# Patient Record
Sex: Male | Born: 1985
Health system: Southern US, Community
[De-identification: ages and names within clinical notes are randomized; demographics above are authoritative.]

## PROBLEM LIST (undated history)

## (undated) DIAGNOSIS — E119 Type 2 diabetes mellitus without complications: Secondary | ICD-10-CM

---
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
## 2013-11-22 ENCOUNTER — Encounter (HOSPITAL_COMMUNITY): Payer: Self-pay | Admitting: Emergency Medicine

## 2013-11-22 ENCOUNTER — Emergency Department (INDEPENDENT_AMBULATORY_CARE_PROVIDER_SITE_OTHER)
Admission: EM | Admit: 2013-11-22 | Discharge: 2013-11-22 | Disposition: A | Discharge status: Home / Self Care | Payer: Medicaid Other | Source: Home / Self Care | Attending: Family Medicine | Admitting: Family Medicine

## 2013-11-22 DIAGNOSIS — E119 Type 2 diabetes mellitus without complications: Secondary | ICD-10-CM

## 2013-11-22 DIAGNOSIS — Z794 Long term (current) use of insulin: Secondary | ICD-10-CM

## 2013-11-22 HISTORY — DX: Type 2 diabetes mellitus without complications: E11.9

## 2013-11-22 LAB — POCT I-STAT, CHEM 8
BUN: 11 mg/dL (ref 6–23)
Calcium, Ion: 1.21 mmol/L (ref 1.12–1.23)
Chloride: 100 mEq/L (ref 96–112)
Creatinine, Ser: 0.9 mg/dL (ref 0.50–1.35)
GLUCOSE: 246 mg/dL — AB (ref 70–99)
HEMATOCRIT: 46 % (ref 39.0–52.0)
HEMOGLOBIN: 15.6 g/dL (ref 13.0–17.0)
Potassium: 4.3 mEq/L (ref 3.7–5.3)
Sodium: 139 mEq/L (ref 137–147)
TCO2: 29 mmol/L (ref 0–100)

## 2013-11-22 MED ORDER — INSULIN NPH (HUMAN) (ISOPHANE) 100 UNIT/ML ~~LOC~~ SUSP: 30.0000 [IU] | Freq: Two times a day (BID) | SUBCUTANEOUS | Status: DC

## 2013-11-22 NOTE — ED Notes (Signed)
Here w friend who is acting as Nurse, learning disabilitytranslator. Needs medication for his DM; has an appointment later this month

## 2013-11-22 NOTE — Discharge Instructions (Signed)
See your doctor as planned for further diabetes care.

## 2013-11-22 NOTE — ED Provider Notes (Signed)
CSN: [id number]     Arrival date & time 11/22/13  1718 History   First MD Initiated Contact with Patient 11/22/13 1758     Chief Complaint  Patient presents with  . Diabetes   (Consider location/radiation/quality/duration/timing/severity/associated sxs/prior Treatment) Patient is a 28 y.o. male presenting with diabetes problem. The history is provided by the patient and a relative. The history is limited by a language barrier. Language interpreter used: friend transl.  Diabetes This is a chronic problem. Episode onset: here from [place], takes insulin for dm, out of med, took dose this am, sugar was 75. The problem has not changed since onset.Pertinent negatives include no chest pain and no abdominal pain.    Past Medical History  Diagnosis Date  . Diabetes mellitus without complication    History reviewed. No pertinent past surgical history. History reviewed. No pertinent family history. History  Substance Use Topics  . Smoking status: Never Smoker   . Smokeless tobacco: Not on file  . Alcohol Use: No    Review of Systems  Constitutional: Negative.   Respiratory: Negative.   Cardiovascular: Negative.  Negative for chest pain.  Gastrointestinal: Negative.  Negative for abdominal pain.  Neurological: Negative.     Allergies  Review of patient's allergies indicates no known allergies.  Home Medications   Prior to Admission medications   Not on File   BP 137/81  Pulse 75  Temp(Src) 98.5 F (36.9 C) (Oral)  Resp 12  SpO2 99% Physical Exam  Nursing note and vitals reviewed. Constitutional: He is oriented to person, place, and time. He appears well-developed and well-nourished.  Neck: Normal range of motion. Neck supple.  Cardiovascular: Normal heart sounds.   Pulmonary/Chest: Effort normal and breath sounds normal.  Abdominal: Soft. Bowel sounds are normal.  Lymphadenopathy:    He has no cervical adenopathy.  Neurological: He is alert and oriented to person, place, and  time.    ED Course  Procedures (including critical care time) Labs Review Labs Reviewed  POCT I-STAT, CHEM 8 - Abnormal; Notable for the following:    Glucose, Bld 246 (*)    All other components within normal limits    Imaging Review No results found.   MDM   1. Diabetes mellitus type 2, insulin dependent        Linna HoffJames D Cuahutemoc Attar, MD 11/22/13 [phone]

## 2013-12-04 ENCOUNTER — Other Ambulatory Visit (HOSPITAL_COMMUNITY)
Admission: RE | Admit: 2013-12-04 | Discharge: 2013-12-04 | Disposition: A | Discharge status: Home / Self Care | Payer: Medicaid Other | Source: Ambulatory Visit | Attending: Family Medicine | Admitting: Family Medicine

## 2013-12-04 ENCOUNTER — Ambulatory Visit (INDEPENDENT_AMBULATORY_CARE_PROVIDER_SITE_OTHER): Payer: Medicaid Other | Admitting: Family Medicine

## 2013-12-04 VITALS — BP 115/74 | HR 60 | Temp 98.2°F | Ht 64.5 in | Wt 127.0 lb

## 2013-12-04 DIAGNOSIS — Z23 Encounter for immunization: Secondary | ICD-10-CM

## 2013-12-04 DIAGNOSIS — Z0289 Encounter for other administrative examinations: Secondary | ICD-10-CM | POA: Insufficient documentation

## 2013-12-04 DIAGNOSIS — Z113 Encounter for screening for infections with a predominantly sexual mode of transmission: Secondary | ICD-10-CM | POA: Insufficient documentation

## 2013-12-04 DIAGNOSIS — E1139 Type 2 diabetes mellitus with other diabetic ophthalmic complication: Secondary | ICD-10-CM

## 2013-12-04 DIAGNOSIS — E11319 Type 2 diabetes mellitus with unspecified diabetic retinopathy without macular edema: Secondary | ICD-10-CM

## 2013-12-04 DIAGNOSIS — IMO0002 Reserved for concepts with insufficient information to code with codable children: Secondary | ICD-10-CM

## 2013-12-04 DIAGNOSIS — E1065 Type 1 diabetes mellitus with hyperglycemia: Secondary | ICD-10-CM

## 2013-12-04 LAB — CBC WITH DIFFERENTIAL/PLATELET
BASOS ABS: 0.1 10*3/uL (ref 0.0–0.1)
Basophils Relative: 1 % (ref 0–1)
EOS PCT: 5 % (ref 0–5)
Eosinophils Absolute: 0.3 10*3/uL (ref 0.0–0.7)
HCT: 39.1 % (ref 39.0–52.0)
Hemoglobin: 13.5 g/dL (ref 13.0–17.0)
LYMPHS PCT: 40 % (ref 12–46)
Lymphs Abs: 2.2 10*3/uL (ref 0.7–4.0)
MCH: 29.2 pg (ref 26.0–34.0)
MCHC: 34.5 g/dL (ref 30.0–36.0)
MCV: 84.4 fL (ref 78.0–100.0)
MONO ABS: 0.2 10*3/uL (ref 0.1–1.0)
MONOS PCT: 4 % (ref 3–12)
Neutro Abs: 2.8 10*3/uL (ref 1.7–7.7)
Neutrophils Relative %: 50 % (ref 43–77)
Platelets: 251 10*3/uL (ref 150–400)
RBC: 4.63 MIL/uL (ref 4.22–5.81)
RDW: 13.6 % (ref 11.5–15.5)
WBC: 5.5 10*3/uL (ref 4.0–10.5)

## 2013-12-04 LAB — POCT URINALYSIS DIPSTICK
Bilirubin, UA: NEGATIVE
GLUCOSE UA: 500
KETONES UA: NEGATIVE
Leukocytes, UA: NEGATIVE
Nitrite, UA: NEGATIVE
Protein, UA: NEGATIVE
RBC UA: NEGATIVE
SPEC GRAV UA: 1.025
UROBILINOGEN UA: 0.2
pH, UA: 6

## 2013-12-04 LAB — POCT GLYCOSYLATED HEMOGLOBIN (HGB A1C): Hemoglobin A1C: 7

## 2013-12-04 MED ORDER — INSULIN NPH (HUMAN) (ISOPHANE) 100 UNIT/ML ~~LOC~~ SUSP: 30.0000 [IU] | Freq: Two times a day (BID) | SUBCUTANEOUS | Status: DC

## 2013-12-04 NOTE — Assessment & Plan Note (Signed)
Uncontrolled based on home numbers. Will check A1c today. Continue NPH BID for now. Referral to endocrine placed. Follow up in 6 weeks.

## 2013-12-04 NOTE — Patient Instructions (Addendum)
It was nice to meet you!  We have sent a prescription for your insulin to [facility] on [address]. We also put in a referral for you to see a diabetes specialist.    Follow up in the next 6 weeks.

## 2013-12-04 NOTE — Assessment & Plan Note (Signed)
Screening labs obtained today. Tetanus given today.

## 2013-12-04 NOTE — Assessment & Plan Note (Signed)
Has appt with eye specialist tomorrow.

## 2013-12-04 NOTE — Progress Notes (Signed)
   Subjective:    Patient ID: Barry Balsamli Sick, male    DOB: Mar 23, 1986, 28 y.o.   MRN: [id number]  HPI Barry Potter is here for a new patient appointment.  Interpreter present for entire visit.  He is here today to establish care.  States he has diabetes.  Diagnosed about 10 years ago.  He has always been on insulin.  In [place] he took insulin before meals.  Since being here, he has been using NPH BID.  He does not like giving himself the insulin shots.  CBGs at home are around 250.    He was seen by an eye doctor yesterday and referred to an eye specialist for problems in the left eye.  No other acute concerns.  Current Outpatient Prescriptions on File Prior to Visit  Medication Sig Dispense Refill  . insulin NPH Human (HUMULIN N,NOVOLIN N) 100 UNIT/ML injection Inject 0.3 mLs (30 Units total) into the skin 2 (two) times daily before a meal.  10 mL  11   No current facility-administered medications on file prior to visit.    I have reviewed and updated the following as appropriate: allergies, current medications, past family history, past medical history, past social history, past surgical history and problem list SHx: never smoker PHMx: diabetes  Arrived in [place]:  Date: early April 2015 Home country: [place] Reason for leaving: fled [place] as family worked for [place] company  Language:  Arabic Does Futures traderequire intepreter   Education:  8th grade; worked in Firefighterreception  Preventative Care History:  Vaccinations: unknown   Other:  Tobacco: none  EtOH: none   [facility]   Review of Systems See HPI    Objective:   Physical Exam BP 115/74  Pulse 60  Temp(Src) 98.2 F (36.8 C) (Oral)  Ht 5' 4.5" (1.638 m)  Wt 127 lb (57.607 kg)  BMI 21.47 kg/m2 Gen: alert, cooperative, NAD HEENT: AT/[place], sclera white, MMM, PERRL Neck: supple, no LAD, normal thyroid CV: RRR, no murmurs Pulm: CTAB, no wheezes or rales Abd: +BS, soft, NTND, no hepatosplenomegaly Ext: no edema, 2+ DP pulses  bilaterally      Assessment & Plan:

## 2013-12-05 LAB — SICKLE CELL SCREEN: SICKLE CELL SCREEN: NEGATIVE

## 2013-12-05 LAB — COMPREHENSIVE METABOLIC PANEL
ALT: 15 U/L (ref 0–53)
AST: 16 U/L (ref 0–37)
Albumin: 4.1 g/dL (ref 3.5–5.2)
Alkaline Phosphatase: 39 U/L (ref 39–117)
BUN: 14 mg/dL (ref 6–23)
CO2: 29 mEq/L (ref 19–32)
Calcium: 9.5 mg/dL (ref 8.4–10.5)
Chloride: 100 mEq/L (ref 96–112)
Creat: 0.84 mg/dL (ref 0.50–1.35)
Glucose, Bld: 343 mg/dL — ABNORMAL HIGH (ref 70–99)
Potassium: 4.5 mEq/L (ref 3.5–5.3)
Sodium: 135 mEq/L (ref 135–145)
Total Bilirubin: 0.6 mg/dL (ref 0.2–1.2)
Total Protein: 6.6 g/dL (ref 6.0–8.3)

## 2013-12-05 LAB — VARICELLA ZOSTER ANTIBODY, IGG: Varicella IgG: 1992 Index — ABNORMAL HIGH (ref <135.00)

## 2013-12-05 LAB — HEPATITIS B SURFACE ANTIGEN: HEP B S AG: NEGATIVE

## 2013-12-06 LAB — QUANTIFERON TB GOLD ASSAY (BLOOD)
INTERFERON GAMMA RELEASE ASSAY: NEGATIVE
Mitogen value: >10 IU/mL
Quantiferon Nil Value: 0.03 IU/mL
Quantiferon Tb Ag Minus Nil Value: 0.01 IU/mL
TB AG VALUE: 0.04 [IU]/mL

## 2013-12-25 ENCOUNTER — Ambulatory Visit: Payer: Self-pay | Admitting: Internal Medicine

## 2014-01-22 ENCOUNTER — Encounter: Payer: Self-pay | Admitting: Internal Medicine

## 2014-01-22 ENCOUNTER — Ambulatory Visit (INDEPENDENT_AMBULATORY_CARE_PROVIDER_SITE_OTHER): Payer: Medicaid Other | Admitting: Internal Medicine

## 2014-01-22 VITALS — BP 122/80 | HR 70 | Temp 98.2°F | Resp 12 | Ht 66.0 in | Wt 130.0 lb

## 2014-01-22 DIAGNOSIS — IMO0002 Reserved for concepts with insufficient information to code with codable children: Secondary | ICD-10-CM

## 2014-01-22 DIAGNOSIS — E1065 Type 1 diabetes mellitus with hyperglycemia: Secondary | ICD-10-CM

## 2014-01-22 MED ORDER — INSULIN PEN NEEDLE 32G X 4 MM MISC: Status: DC

## 2014-01-22 MED ORDER — INSULIN ASPART 100 UNIT/ML FLEXPEN: 5.0000 [IU] | PEN_INJECTOR | Freq: Three times a day (TID) | SUBCUTANEOUS | Status: DC

## 2014-01-22 MED ORDER — INSULIN GLARGINE 100 UNIT/ML SOLOSTAR PEN: 25.0000 [IU] | PEN_INJECTOR | Freq: Every day | SUBCUTANEOUS | Status: DC

## 2014-01-22 MED ORDER — GLUCAGON (RDNA) 1 MG IJ KIT: 1.0000 mg | PACK | Freq: Once | INTRAMUSCULAR | Status: DC | PRN

## 2014-01-22 NOTE — Progress Notes (Signed)
Patient ID: Barry Potter, male   DOB: Nov 26, 1985, 28 y.o.   MRN: [id number]  HPI: Barry Potter is a 28 y.o.-year-old male, referred by his PCP, Dr. Bridgett Larsson, for management of DM1, uncontrolled, without complications.  Patient has been diagnosed with diabetes in 2002; he started on insulin at dx.   Last hemoglobin A1c was: Lab Results  Component Value Date   HGBA1C 7.0 12/04/2013   Pt is on: - Humulin NPH 30 units in am and 20 units before dinner This was changed at he time he came to [place] from [place] (4 mo ago)  Pt checks his sugars 2x a day and they are: - am: 60-350 - 2h after b'fast: n/c - before lunch: n/c - 2h after lunch: n/c - before dinner: n/c - 2h after dinner: [name] - bedtime: 130-150 - nighttime: n/c No lows. Lowest sugar was 40; he has hypoglycemia awareness at 60. No previous hypoglycemia admission.  Does not have a glucagon kit at home. Highest sugar was 300s . No previous DKA admissions. He was admitted in 2005 for high sugars ? DKA.  Pt's meals are: - 4 pm: egg, tea, bread - largest meal of the day - 10-11 pm: rice, meat, salad - 9 am: yoghurt, salad, rice - smallest meal of the day - Snacks: 3x or more  He works 8 pm to 8 am.   - no CKD, last BUN/creatinine:  Lab Results  Component Value Date   BUN 14 12/04/2013   CREATININE 0.84 12/04/2013  - last eye exam was in 2 weeks. + R DR - mild (reportedly) - no numbness and tingling in his feet.  Pt has FH of DM in sister.  ROS: Constitutional: no weight gain/loss, no fatigue, no subjective hyperthermia/hypothermia Eyes: + blurry vision, no xerophthalmia ENT: no sore throat, no nodules palpated in throat, no dysphagia/odynophagia, no hoarseness Cardiovascular: no CP/SOB/palpitations/leg swelling Respiratory: no cough/SOB Gastrointestinal: no N/V/D/C Musculoskeletal: no muscle/joint aches Skin: no rashes Neurological: no tremors/numbness/tingling/dizziness Psychiatric: no depression/anxiety  Past Medical History   Diagnosis Date  . Diabetes mellitus without complication    Past Surgical History  Procedure Laterality Date  . Tonsillectomy  2001   History   Social History  . Marital Status: Single    Spouse Name: N/A    Number of Children: 0   Occupational History  . 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   Social History Main Topics  . Smoking status: Never Smoker   . Smokeless tobacco: Never Used  . Alcohol Use: No  . Drug Use: No   Current Outpatient Rx  Name  Route  Sig  Dispense  Refill  . insulin NPH Human (HUMULIN N,NOVOLIN N) 100 UNIT/ML injection   Subcutaneous   Inject 30 Units into the skin every morning. 18 units at night.         Marland Kitchen VITAMIN D, ERGOCALCIFEROL, PO   Oral   Take 2 tablets by mouth daily.          No Known Allergies Family History  Problem Relation Age of Onset  . Diabetes Sister     PE: BP 122/80  Pulse 70  Temp(Src) 98.2 F (36.8 C) (Oral)  Resp 12  Ht '5\' 6"'  (1.676 m)  Wt 130 lb (58.968 kg)  BMI 20.99 kg/m2  SpO2 98% Wt Readings from Last 3 Encounters:  01/22/14 130 lb (58.968 kg)  12/04/13 127 lb (57.607 kg)   Constitutional: thin, in NAD Eyes: PERRLA,  EOMI, no exophthalmos ENT: moist mucous membranes, no thyromegaly, no cervical lymphadenopathy Cardiovascular: RRR, No MRG Respiratory: CTA B Gastrointestinal: abdomen soft, NT, ND, BS+ Musculoskeletal: no deformities, strength intact in all 4 Skin: moist, warm, no rashes Neurological: no tremor with outstretched hands, DTR normal in all 4  ASSESSMENT: 1. DM1, uncontrolled, without complications  PLAN:  1. Patient with long standing DM1, on insulin therapy. He is interested in a pump - discussed how the pump works - he would like to get this before his M'aid runs out in ~ 3 mo. For now, since a NPH-only regimen is not an optimal regimen for a DM1 pt >> we decided to change to: - We discussed about changes to his insulin regimen, as follows:  Patient  Instructions  Please stop Humulin N. Start Lantus 25 units at bedtime. Start NovoLog  - 8 units with the 4 pm meal - 6 units with the 10 pm meal - 5 units with the 9 am meal Please come to the lab fasting at any time in the next month - ideally at 4 pm before your meal. Make sure your sugar is <180 before you come as the labs that we are going to check require a sugar of <220. Please come back in 1 month with your sugar log. Please call with any question or concern. - will switch to pens - sent needles and pens to his pharmacy - given brochure and also demonstrated pen use - Strongly advised him to start checking sugars at different times of the day - check at least 3 times a day, rotating checks - given sugar log and advised how to fill it and to bring it at next appt  - given foot care handout and explained the principles  - given instructions for hypoglycemia management "15-15 rule"  - advised for yearly eye exams - sent glucagon kit Rx to pharmacy - advised to get ketone strips - advised to always have Glu tablets with him - advised for a Med-alert bracelet mentioning "type 1 diabetes mellitus". - will refer to DM education at next visit - given instruction Re: exercising and driving in DM1 (pt instructions) - no signs of other autoimmune disorders - but will check a TSH - will check a fasting C-peptide and Glu - he is to come back fasting in the next few days - Return to clinic in 1 mo with sugar log   - time spent with the patient: 1 hour, of which >50% was spent in obtaining information about his disease, reviewing previous labs, office visit notes, and DM treatments, and the majority of time counseling pt about his condition (please see the discussed topics above), and developing a plan to prevent further hypoglycemia and hyperglycemia.

## 2014-01-22 NOTE — Patient Instructions (Addendum)
Please stop Humulin N. Start Lantus 25 units at bedtime. Start NovoLog  - 8 units with the 4 pm meal - 6 units with the 10 pm meal - 5 units with the 9 am meal  Please come to the lab fasting at any time in the next month - ideally at 4 pm before your meal. Make sure your sugar is <180 before you come as the labs that we are going to check require a sugar of <220.  Please come back in 1 month with your sugar log.  Please call with any question or concern.  Basic Rules for Patients with Type I Diabetes Mellitus  1. The American Diabetes Association (ADA) recommended targets: - fasting sugar <130 - after meal sugar <180 - HbA1C <7%  2. Engage in ?150 min moderate exercise per week  3. Make sure you have ?8h of sleep every night as this helps both blood sugars and your weight.  4. Always keep a sugar log (not only record in your meter) and bring it to all appointments with us.  5. If you are on a pump, know how to access the settings and to modify the parameters.  6.  Remember, you can always call the number on the back of the pump for emergencies related to the pump.  7. "15-15 rule" for hypoglycemia: if sugars are low, take 15 g of carbs** ("fast sugar" - e.g. 4 glucose tablets, 4 oz orange juice), wait 15 min, then check sugars again. If still <80, repeat. Continue  until your sugars >80, then eat a normal meal.   8. Teach family members and coworkers to inject glucagon. Have a glucagon set at home and one at work. They should call 911 after using the set.  9. If you are on a pump, set "insulin on board" time for 5 hours (if your sugars tend to be higher, can use 4 hours).   10. If you are on a pump, use the "dual wave bolus" setting for high fat foods (e.g. pizza). Start with a setting of 50%-50% (50% instant bolus and 50% prolonged bolus over 3h, for e.g.).    11. If you are on a pump, make sure the basal daily insulin dose is approximately equal (not larger) to the daily  insulin you get from boluses, otherwise you are at risk for hypoglycemia.  12. Check sugar before driving. If <100, correct, and only start driving if sugars rise ?161100. Check sugar every hour when on a long drive.  13. Check sugar before exercising. If <100, correct, and only start exercising if sugars rise ?100. Check sugar every hour when on a long exercise routine and 1h after you finished exercising.   If >250, check urine for ketones. If you have moderate-large ketones in urine, do not start exercise. Hydrate yourself with clear liquids and correct the high sugar. Recheck sugars and ketones before attempting to exercise.  Be aware that you might need less insulin when exercising.  *intense, short, exercise bursts can increase your sugars, but  *less intense, longer (>1h), exercise routines can decrease your sugars.  If you are on a pump, you might need to decrease your basal rate by 10% or more (or even disconnect your pump) while you exercise to prevent low sugars. Do not disconnect your pump by more than 3 hours at a time! You also might need to decrease your insulin bolus for the meal prior to your exercise time by 20% or more.  14. Make  sure you have a MedAlert bracelet or pendant mentioning "Type I Diabetes Mellitus". If you have a prior episode of severe hypoglycemia or hypoglycemia unawareness, it should also mention this.  15. Please do not walk barefoot. Inspect your feet for sores/cuts and let us know if you have them.  16. Please call [facility] with any questions and concerns [phone]).   **E.g. of "fast carbs":   first choice (15 g):  1 tube glucose gel, GlucoPouch 15, 2 oz glucose liquid   second choice (15-16 g):  3 or 4 glucose tablets (best taken  with water), 15 Dextrose Bits chewable   third choice (15-20 g):   cup fruit juice,  cup regular soda, 1 cup skim milk,  1 cup sports drink   fourth choice (15-20 g):  1 small tube [place] gel (not  frosting), 2 tbsp raisins, 1 tbsp table sugar,  candy, jelly beans, gum drops - check package for carb amount   (adapted from: Juluis RainierMcCall A.L. "Insulin therapy and hypoglycemia" Endocrinol Metab Clin N Am 2012, 41: 57-87)

## 2014-02-22 ENCOUNTER — Other Ambulatory Visit: Payer: Self-pay | Admitting: *Deleted

## 2014-02-22 ENCOUNTER — Encounter: Payer: Self-pay | Admitting: Internal Medicine

## 2014-02-22 ENCOUNTER — Ambulatory Visit (INDEPENDENT_AMBULATORY_CARE_PROVIDER_SITE_OTHER): Payer: Medicaid Other | Admitting: Internal Medicine

## 2014-02-22 VITALS — BP 118/68 | HR 75 | Temp 98.6°F | Resp 12 | Wt 129.0 lb

## 2014-02-22 DIAGNOSIS — E10319 Type 1 diabetes mellitus with unspecified diabetic retinopathy without macular edema: Secondary | ICD-10-CM

## 2014-02-22 DIAGNOSIS — E11319 Type 2 diabetes mellitus with unspecified diabetic retinopathy without macular edema: Secondary | ICD-10-CM

## 2014-02-22 DIAGNOSIS — IMO0002 Reserved for concepts with insufficient information to code with codable children: Secondary | ICD-10-CM

## 2014-02-22 DIAGNOSIS — E1065 Type 1 diabetes mellitus with hyperglycemia: Secondary | ICD-10-CM

## 2014-02-22 DIAGNOSIS — E1039 Type 1 diabetes mellitus with other diabetic ophthalmic complication: Secondary | ICD-10-CM

## 2014-02-22 LAB — GLUCOSE, POCT (MANUAL RESULT ENTRY): POC Glucose: 249 mg/dl — AB (ref 70–99)

## 2014-02-22 MED ORDER — BLOOD GLUCOSE TEST VI STRP: ORAL_STRIP | Status: DC

## 2014-02-22 NOTE — Patient Instructions (Addendum)
Decrease Lantus to 25 units in am  Decrease NovoLog  - 6 units with breakfast - 5 units with lunch - 3 units with dinner   Inject the NovoLog 10-15 min before you eat. Do not skip doses.  Please come back in 2 weeks with your sugar log.

## 2014-02-22 NOTE — Progress Notes (Signed)
Patient ID: Barry Potter, male   DOB: April 10, 1986, 28 y.o.   MRN: [id number]  HPI: Barry Potter is a 28 y.o.-year-old male, initially referred by his PCP, Dr. Bridgett Larsson, for management of DM1, dx 2002, uncontrolled, with complications (mild DR). He returns for f/u. Last visit 1 mo ago.  Last hemoglobin A1c was: Lab Results  Component Value Date   HGBA1C 7.0 12/04/2013   Pt was on: - Humulin NPH 30 units in am and 20 units before dinner This was changed at he time he came to [place] from [place] 07/2013.  We changed to: Lantus 25 units in am (not at bedtime as advised). NovoLog  - 8 units with the 4 pm meal - 6 units with the 10 pm meal - 5 units with the 9 am meal  He is now using (!): Lantus 25 in am and 20 units in pm He mentions his sugars dropped when he was taking Lantus and NovoLog as prescribed. He cannot call me as he does not speak [place].   Pt checks his sugars 2x a day and they are: - am: 60-350 >> 48-135 - 2h after b'fast: n/c >> 60-200 - before lunch: n/c >> 65-185 - 2h after lunch: n/c >> 170-200 - before dinner: n/c >> 71-175 - 2h after dinner: [place] >> 110-200 - bedtime: 130-150 >> 120-190 - nighttime: n/c >> 60-120 No lows. Lowest sugar was 48; he has hypoglycemia awareness at 60. No previous hypoglycemia admission.  Does not have a glucagon kit at home. Highest sugar was 200. No previous DKA admissions. He was admitted in 2005 for high sugars ? DKA.  Pt's meals are: - am: egg, tea, bread - largest meal of the day - lunch: rice, meat, salad - dinner: yoghurt, salad, rice - smallest meal of the day - Snacks: 3x or more  He worked 8 pm to 8 am >> now works 6 am-6 pm.  - no CKD, last BUN/creatinine:  Lab Results  Component Value Date   BUN 14 12/04/2013   CREATININE 0.84 12/04/2013  - last eye exam was in 11/2013. + R DR - mild (reportedly) - no numbness and tingling in his feet.  I reviewed pt's medications, allergies, PMH, social hx, family hx and no changes required,  except as mentioned above.  ROS: Constitutional: + weight loss, + fatigue, no subjective hyperthermia/hypothermia, + poor sleep Eyes: + blurry vision, no xerophthalmia ENT: no sore throat, no nodules palpated in throat, + dysphagia/no odynophagia, no hoarseness, + tinnitus Cardiovascular: no CP/SOB/palpitations/leg swelling Respiratory: no cough/SOB Gastrointestinal: no N/V/D/C Musculoskeletal: no muscle/joint aches Skin: no rashes Neurological: no tremors/numbness/tingling/dizziness, + HA  PE: BP 118/68  Pulse 75  Temp(Src) 98.6 F (37 C) (Oral)  Resp 12  Wt 129 lb (58.514 kg)  SpO2 97% Wt Readings from Last 3 Encounters:  02/22/14 129 lb (58.514 kg)  01/22/14 130 lb (58.968 kg)  12/04/13 127 lb (57.607 kg)   Constitutional: thin, in NAD Eyes: PERRLA, EOMI, no exophthalmos ENT: moist mucous membranes, no thyromegaly, no cervical lymphadenopathy Cardiovascular: RRR, No MRG Respiratory: CTA B Gastrointestinal: abdomen soft, NT, ND, BS+ Musculoskeletal: no deformities, strength intact in all 4 Skin: moist, warm, no rashes Neurological: no tremor with outstretched hands, DTR normal in all 4  ASSESSMENT: 1. DM1, uncontrolled, without complications He is interested in a pump - discussed how the pump works - he would like to get this before his [place] runs out in 04/2014.  PLAN:  1. Patient with long standing  DM1, on insulin therapy. He is only on Lantus (large dose) as he mentioned that his sugars dropped on NovoLog) >> advised that this is not a good regimen for a type 1 diabetic >> will reduce Lantus dose and reintroduce NovoLog at lower doses. - We discussed about changes to his insulin regimen, as follows:  Patient Instructions  Decrease Lantus to 25 units in am  Decrease NovoLog  - 6 units with breakfast - 5 units with lunch - 3 units with dinner  Inject the NovoLog 10-15 min before you eat. Do not skip doses. Please come back in 2 weeks with your sugar log.  - sent  Rx for True Result strips to his pharmacy  - continue checking sugars at different times of the day - check 4-8 times a day, rotating checks - given more sugar logs  - will refer to DM education and prepump training  - he will also have an appt with nutrition - we need to check a fasting C-peptide and Glu - sugar in the office 249 >> cannot check today (needs to be <220) - Return to clinic in 2 weeks with sugar log

## 2014-02-27 ENCOUNTER — Ambulatory Visit: Payer: Medicaid Other | Admitting: Nutrition

## 2014-02-28 ENCOUNTER — Telehealth: Payer: Self-pay | Admitting: Internal Medicine

## 2014-02-28 MED ORDER — ACCU-CHEK AVIVA PLUS W/DEVICE KIT: PACK | Status: DC

## 2014-02-28 MED ORDER — ACCU-CHEK SOFTCLIX LANCETS MISC: Status: DC

## 2014-02-28 MED ORDER — GLUCOSE BLOOD VI STRP: ORAL_STRIP | Status: DC

## 2014-02-28 NOTE — Telephone Encounter (Signed)
Called the pharmacy to ask which medication did we not send. Pharmacy tech said that Medicaid will not cover True Track test strips. They require Accu-chek Aviva Plus meter, strips and lancets. Asked if we would send a new rx for these to the pharmacy for pt. Advised we would. Be advised.

## 2014-02-28 NOTE — Telephone Encounter (Signed)
If it is the True track, I did send it, but let's resend. I did not send a Rx for a med - please check with him if he needs one of the insulins. You may need to call through the interpreter.

## 2014-02-28 NOTE — Telephone Encounter (Signed)
Please read note below and advise.  

## 2014-02-28 NOTE — Telephone Encounter (Signed)
Patient stated that he went to his pharmacy to pick up his medicine they hasn't receive it yet. So could you refax it please. Interpreter didn't know which med it was. [facility] [address]

## 2014-03-12 ENCOUNTER — Encounter: Payer: Self-pay | Admitting: Internal Medicine

## 2014-03-12 ENCOUNTER — Ambulatory Visit (INDEPENDENT_AMBULATORY_CARE_PROVIDER_SITE_OTHER): Payer: Medicaid Other | Admitting: Internal Medicine

## 2014-03-12 VITALS — BP 104/78 | HR 72 | Temp 98.0°F | Resp 12 | Wt 128.0 lb

## 2014-03-12 DIAGNOSIS — E1065 Type 1 diabetes mellitus with hyperglycemia: Secondary | ICD-10-CM

## 2014-03-12 DIAGNOSIS — IMO0002 Reserved for concepts with insufficient information to code with codable children: Secondary | ICD-10-CM

## 2014-03-12 NOTE — Patient Instructions (Signed)
Please change your insulin regimen to: - Lantus to 25 units at bedtime  NovoLog  - 6 units with breakfast  - 5 units with lunch - 4 units with dinner   Please schedule a new appointment in 3 weeks.

## 2014-03-12 NOTE — Progress Notes (Signed)
Patient ID: Barry Potter, male   DOB: 12/30/1985, 28 y.o.   MRN: [id number]  HPI: Anup Brigham is a 28 y.o.-year-old male, initially referred by his PCP, Dr. Bridgett Larsson, for management of DM1, dx 2002, uncontrolled, with complications (mild DR). He returns for f/u. Last visit 2 weeks ago.  Last hemoglobin A1c was: Lab Results  Component Value Date   HGBA1C 7.0 12/04/2013   Pt was on: - Humulin NPH 30 units in am and 20 units before dinner This was changed at he time he came to [place] from [place] 07/2013.  We changed to: Lantus 25 units in am (not at bedtime as advised). NovoLog  - 8 units with the 4 pm meal - 6 units with the 10 pm meal - 5 units with the 9 am meal  But he had some lows >> before last visit he changed to: Lantus 25 in am and 20 units in pm!!!  He should be on: Lantus to 25 units in am  NovoLog  - 6 units with breakfast  - 5 units with lunch - 3 units with dinner   But he again changed to: Lantus 25 in am and 20 units in pm!!! Despite multiple attempts at adding mealtime insulin. 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 He can take breaks on this new job.  Pt checks his sugars 2-4x a day and they are: - am: 60-350 >> 48-135 >> 41-177 - 2h after b'fast: n/c >> 60-200 >> n/c - before lunch: n/c >> 65-185 >> 61-214 - 2h after lunch: n/c >> 170-200 >> 53-159 (277x1) - before dinner: n/c >> 71-175 >> 75, 147 - 2h after dinner: [place] >> 110-200 >> 64-208 - bedtime: 130-150 >> 120-190 >> 86-125 - nighttime: n/c >> 60-120 >> n/c No lows. Lowest sugar was 41; he has hypoglycemia awareness at 60. No previous hypoglycemia admission.  Does have a glucagon kit at home. Highest sugar was in the 200s. No previous DKA admissions. He was admitted in 2005 for high sugars ? DKA.  Pt's meals are: - am: egg, tea, bread - largest meal of the day - lunch: rice, meat, salad - dinner: yoghurt, salad, rice -  smallest meal of the day - Snacks: 3x or more  He worked 8 pm to 8 am >> now works 6 am-6 pm.  - no CKD, last BUN/creatinine:  Lab Results  Component Value Date   BUN 14 12/04/2013   CREATININE 0.84 12/04/2013  - last eye exam was in 11/2013. + R DR - mild (reportedly) - no numbness and tingling in his feet.  I reviewed pt's medications, allergies, PMH, social hx, family hx and no changes required, except as mentioned above.  ROS: Constitutional: + weight loss, + fatigue, no subjective hyperthermia/hypothermia, + poor sleep Eyes: + blurry vision, no xerophthalmia ENT: no sore throat, no nodules palpated in throat, no dysphagia/no odynophagia, no hoarseness, + tinnitus Cardiovascular: no CP/SOB/palpitations/leg swelling Respiratory: no cough/SOB Gastrointestinal: no N/V/D/C Musculoskeletal: no muscle/joint aches Skin: no rashes Neurological: no tremors/numbness/tingling/dizziness, + tingling fingers and toes  PE: BP 104/78  Pulse 72  Temp(Src) 98 F (36.7 C) (Oral)  Resp 12  Wt 128 lb (58.06 kg)  SpO2 98% Wt Readings from Last 3 Encounters:  03/12/14 128 lb (58.06 kg)  02/22/14 129 lb (58.514 kg)  01/22/14 130 lb (58.968 kg)   Constitutional: thin, in NAD  Eyes: PERRLA, EOMI, no exophthalmos ENT: moist mucous membranes, no thyromegaly, no cervical lymphadenopathy Cardiovascular: RRR, No MRG Respiratory: CTA B Gastrointestinal: abdomen soft, NT, ND, BS+ Musculoskeletal: no deformities, strength intact in all 4 Skin: moist, warm, no rashes Neurological: no tremor with outstretched hands, DTR normal in all 4  ASSESSMENT: 1. DM1, uncontrolled, without complications He is interested in a pump - discussed how the pump works - he would like to get this before his [place] runs out in 04/2014.  2. Numbness fingers and toes  PLAN:  1. Patient with long standing DM1, on insulin therapy. He is only on Lantus (large dose) despite my repeated advice that this is not a good regimen  for him as it is conducive to low CBGs >> again advised that this is not a good regimen for a type 1 diabetic >> will reduce Lantus dose and reintroduce NovoLog. We cannot introduce an insulin pump if he is not receptive to the above changes... I am concerned that (mostly because of the language barrier) he may not do well on a pump. - We discussed about changes to his insulin regimen, as follows:  Patient Instructions  Please change your insulin regimen to: - Lantus to 25 units at bedtime  NovoLog  - 6 units with breakfast  - 5 units with lunch - 4 units with dinner  Please schedule a new appointment in 3 weeks.  - I advised him to increase the NovoLog doses by 1-2 units, if needed, with a problem meal - continue checking sugars at different times of the day - check >4 times a day, rotating checks - he will also have an appt with nutrition - we need to check a fasting C-peptide and Glu  - will also need a Lipid panel - Return to clinic in 3 weeks with sugar log   2. Numbness in fingers, toes  - this can be from his many hypoglycemia events - needs a B12 check at next visit

## 2014-03-17 ENCOUNTER — Encounter: Payer: Self-pay | Admitting: Family Medicine

## 2014-04-09 ENCOUNTER — Encounter: Payer: Medicaid Other | Attending: Internal Medicine | Admitting: Nutrition

## 2014-04-09 ENCOUNTER — Encounter: Payer: Self-pay | Admitting: Internal Medicine

## 2014-04-09 ENCOUNTER — Ambulatory Visit (INDEPENDENT_AMBULATORY_CARE_PROVIDER_SITE_OTHER): Payer: Medicaid Other | Admitting: Internal Medicine

## 2014-04-09 ENCOUNTER — Other Ambulatory Visit: Payer: Self-pay | Admitting: *Deleted

## 2014-04-09 VITALS — BP 110/68 | HR 64 | Temp 98.0°F | Resp 12 | Wt 124.8 lb

## 2014-04-09 DIAGNOSIS — E1065 Type 1 diabetes mellitus with hyperglycemia: Secondary | ICD-10-CM | POA: Insufficient documentation

## 2014-04-09 DIAGNOSIS — Z4681 Encounter for fitting and adjustment of insulin pump: Secondary | ICD-10-CM | POA: Insufficient documentation

## 2014-04-09 DIAGNOSIS — E11319 Type 2 diabetes mellitus with unspecified diabetic retinopathy without macular edema: Secondary | ICD-10-CM | POA: Diagnosis not present

## 2014-04-09 DIAGNOSIS — IMO0002 Reserved for concepts with insufficient information to code with codable children: Secondary | ICD-10-CM

## 2014-04-09 DIAGNOSIS — E1039 Type 1 diabetes mellitus with other diabetic ophthalmic complication: Secondary | ICD-10-CM | POA: Insufficient documentation

## 2014-04-09 DIAGNOSIS — Z794 Long term (current) use of insulin: Secondary | ICD-10-CM | POA: Diagnosis not present

## 2014-04-09 NOTE — Patient Instructions (Addendum)
Please continue Lantus 25 units at bedtime. Change NovoLog to: - 8 units before a large meal - 6 units before a regular meal - 4 units before a small meal  Please stop at the lab.  Please also schedule an appointment with Cristy Folks for pre-pump training.

## 2014-04-09 NOTE — Progress Notes (Signed)
Patient ID: Barry Potter, male   DOB: 1985-07-25, 28 y.o.   MRN: [id number]  HPI: Barry Potter is a 28 y.o.-year-old male, initially referred by his PCP, Dr. Piedad Climes, for management of DM1, dx 2002, uncontrolled, with complications (mild DR). He returns for f/u. Last visit 1 mo ago.  Last hemoglobin A1c was: Lab Results  Component Value Date   HGBA1C 7.0 12/04/2013   Pt was on: - Humulin NPH 30 units in am and 20 units before dinner This was changed at he time he came to [place] from [place] 07/2013.  He is on: Lantus to 25 units in hs  NovoLog  - 6 units with breakfast  - 5 units with lunch - 4 units with dinner   He was only taking the basal insulin despite multiple attempts at adding mealtime insulin, but at this visit, he is finally taking the basal-bolus regimen as advised.   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 He can take breaks on this new job.  Pt checks his sugars 2-4x a day and they are: - am: 60-350 >> 48-135 >> 41-177 >> 68-185 - 2h after b'fast: n/c >> 60-200 >> n/c >> 61-271 - before lunch: n/c >> 65-185 >> 61-214 >> 63-192 - 2h after lunch: n/c >> 170-200 >> 53-159 (277x1) >> 73-165, 220 - before dinner: n/c >> 71-175 >> 75, 147 >> 139-242 - 2h after dinner:  >> 110-200 >> 64-208 >> 768-240, 299 - bedtime: 130-150 >> 120-190 >> 86-125 >> 131-203, 294 - nighttime: n/c >> 60-120 >> n/c >> 49, 270 No lows. Lowest sugar was 49; he has hypoglycemia awareness at 60. No previous hypoglycemia admission.  Does have a glucagon kit at home. Highest sugar was 299. No previous DKA admissions. He was admitted in 2005 for high sugars ? DKA.  Pt's meals are: - am: egg, tea, bread - largest meal of the day - lunch: rice, meat, salad - dinner: yoghurt, salad, rice - smallest meal of the day - Snacks: 3x or more  He worked 8 pm to 8 am >> now works 6 am-6 pm.  - no CKD, last BUN/creatinine:  Lab  Results  Component Value Date   BUN 14 12/04/2013   CREATININE 0.84 12/04/2013  - last eye exam was in 11/2013. + R DR - mild (reportedly) - no numbness and tingling in his feet.  I reviewed pt's medications, allergies, PMH, social hx, family hx and no changes required, except as mentioned above.  ROS: Constitutional: + weight loss, + decreased appetite, + fatigue, no subjective hyperthermia/hypothermia, + poor sleep, + nocturia Eyes: no blurry vision, no xerophthalmia ENT: no sore throat, no nodules palpated in throat, no dysphagia/no odynophagia, no hoarseness Cardiovascular: no CP/SOB/palpitations/leg swelling Respiratory: no cough/SOB Gastrointestinal: no N/V/D/C Musculoskeletal: + both: muscle/joint aches Skin: no rashes Neurological: no tremors/numbness/tingling/dizziness, + tingling fingers and toes  PE: BP 110/68  Pulse 64  Temp(Src) 98 F (36.7 C) (Oral)  Resp 12  Wt 124 lb 12.8 oz (56.609 kg)  SpO2 99% Wt Readings from Last 3 Encounters:  04/09/14 124 lb 12.8 oz (56.609 kg)  03/12/14 128 lb (58.06 kg)  02/22/14 129 lb (58.514 kg)   Constitutional: thin, in NAD Eyes: PERRLA, EOMI, no exophthalmos ENT: moist mucous membranes, no thyromegaly, no cervical lymphadenopathy Cardiovascular: RRR, No MRG Respiratory: CTA B Gastrointestinal: abdomen soft, NT, ND, BS+ Musculoskeletal: no  deformities, strength intact in all 4 Skin: moist, warm, no rashes Neurological: no tremor with outstretched hands, DTR normal in all 4  ASSESSMENT: 1. DM1, uncontrolled, without complications He is interested in a pump - discussed how the pump works - he would like to get this before his Gem runs out in 06/2014.  2. Numbness fingers and toes  PLAN:  1. Patient with long standing DM1, on basal-bolus insulin therapy. Sugars fluctuating, lower in am and higher later in the day - We discussed about changes to his NovoLog insulin regimen - gauge the insulin amount on the size of the meal,  as follows:  Patient Instructions  Please continue Lantus 25 units at bedtime. Change NovoLog to: - 8 units before a large meal - 6 units before a regular meal - 4 units before a small meal Please stop at the lab. Please also schedule an appointment with Leonia Reader for pre-pump training.  - advised to inject 15 min before a meal since now he takes the NovoLog after he starts eating - continue checking sugars at different times of the day - check >4 times a day, rotating checks - he will also have an appt with nutrition - we need to check a fasting C-peptide and Glu at next visit, will also need a Lipid panel - check HbA1c today - referred him to prepump training (had this after our visit), also need referral to nutrition for carb counting - Return to clinic in 1 mo with sugar log   2. Numbness in fingers, toes  - needs a B12 check >> will check today  Pt did not stop at the lab >> will check them when he comes back

## 2014-04-09 NOTE — Telephone Encounter (Signed)
Opened encounter in error  

## 2014-04-17 ENCOUNTER — Telehealth: Payer: Self-pay | Admitting: Internal Medicine

## 2014-04-17 NOTE — Telephone Encounter (Signed)
Marquita PalmsMario from [facility] asked if you could fax over lab results, so patient can start using his pump. Fax # [phone]

## 2014-04-17 NOTE — Telephone Encounter (Signed)
Pt forgot to do labs before he left at his last visit. Called pt and lvm advising him that he needs to call and schedule an appt to do labs so we can fax them to [facility], so pt can begin using the pump.

## 2014-04-18 NOTE — Telephone Encounter (Signed)
[employer] [employer], spoke with rep, Caryn BeeKevin. Advised him that the pt did not get labs done when he was here. I have called pt and lvm advising pt to return to have labs done so that he can begin using the pump. Advised we will fax lab results when they are available. They will make a note in pt's chart.

## 2014-04-25 ENCOUNTER — Other Ambulatory Visit: Payer: Self-pay | Admitting: *Deleted

## 2014-04-25 ENCOUNTER — Other Ambulatory Visit: Payer: Self-pay | Admitting: Internal Medicine

## 2014-04-25 ENCOUNTER — Encounter: Payer: Self-pay | Admitting: *Deleted

## 2014-04-25 ENCOUNTER — Encounter: Payer: Medicaid Other | Attending: Internal Medicine | Admitting: *Deleted

## 2014-04-25 ENCOUNTER — Other Ambulatory Visit: Payer: Medicaid Other

## 2014-04-25 VITALS — Ht 66.0 in | Wt 125.7 lb

## 2014-04-25 DIAGNOSIS — IMO0002 Reserved for concepts with insufficient information to code with codable children: Secondary | ICD-10-CM

## 2014-04-25 DIAGNOSIS — Z794 Long term (current) use of insulin: Secondary | ICD-10-CM | POA: Insufficient documentation

## 2014-04-25 DIAGNOSIS — E1065 Type 1 diabetes mellitus with hyperglycemia: Secondary | ICD-10-CM

## 2014-04-25 DIAGNOSIS — Z713 Dietary counseling and surveillance: Secondary | ICD-10-CM | POA: Insufficient documentation

## 2014-04-25 NOTE — Progress Notes (Signed)
Pt. Is here today with the interpreter, and he is very interested in insulin pump therapy.  He is very certain that he wants this and is aware that his insurance will pay for it. He was shown the pump, cartridges and infusions sets and how they are used to deliver insulin.   We discussed the disadvantages of pump therapy, and the need to learn to carb count.  He is willing to learn this and was given some information on this to review with his interpreter.  He agreed to meet with the dietitian to learn carb counting, and the paperwork was explained and he signed off to send for the [employer] insulin pump.   I phoned [employer] and they reported that they have a manual in Arabic, and that they will distribute this when the pump is ordered.  Tis was again noted on the paperwork that was faxed to them. He had no final questions.

## 2014-04-25 NOTE — Patient Instructions (Signed)
Plan: Consider identifying your foods by Starch, Fruit or Milk as containing carbohydrate

## 2014-04-26 LAB — GLUCOSE, RANDOM: GLUCOSE: 90 mg/dL (ref 70–99)

## 2014-04-26 LAB — C-PEPTIDE: C-Peptide: 0 ng/mL — ABNORMAL LOW (ref 0.80–3.90)

## 2014-04-27 ENCOUNTER — Other Ambulatory Visit (INDEPENDENT_AMBULATORY_CARE_PROVIDER_SITE_OTHER): Payer: Medicaid Other

## 2014-04-27 DIAGNOSIS — IMO0002 Reserved for concepts with insufficient information to code with codable children: Secondary | ICD-10-CM

## 2014-04-27 DIAGNOSIS — E1065 Type 1 diabetes mellitus with hyperglycemia: Secondary | ICD-10-CM

## 2014-04-27 LAB — GLUCOSE, RANDOM: Glucose, Bld: 55 mg/dL — ABNORMAL LOW (ref 70–99)

## 2014-05-02 NOTE — Progress Notes (Signed)
Diabetes Self-Management Education  Visit Type:  Initial  Appt. Start Time: 0830 Appt. End Time: 1000  05/02/2014  Mr. Gypsy BalsamAli Facer, identified by name and date of birth, is a 28 y.o. male with a diagnosis of Diabetes: Type 1.  Other people present during visit:  Patient;Interpreter   ASSESSMENT  Height 5\' 6"  (1.676 m), weight 125 lb 11.2 oz (57.017 kg). Body mass index is 20.3 kg/(m^2).  Initial Visit Information:  Are you currently following a meal plan?: No   Are you taking your medications as prescribed?: Yes Are you checking your feet?: No        Psychosocial:     Patient Belief/Attitude about Diabetes: Motivated to manage diabetes Self-care barriers: English as a second language (Arabic interpretor here) Self-management support: Family Other persons present: Patient;Interpreter Patient Concerns: Nutrition/Meal planning Special Needs: Verbal instruction Preferred Learning Style: Auditory;Visual Learning Readiness: Ready  Complications:   Last HgB A1C per patient/outside source: 7 % How often do you check your blood sugar?: 3-4 times/day Number of hypoglycemic episodes per month: 20 Can you tell when your blood sugar is low?: Yes (Symptoms at 40) What do you do if your blood sugar is low?: candy or chocolate Have you had a dilated eye exam in the past 12 months?: Yes Have you had a dental exam in the past 12 months?: Yes  Diet Intake:  Breakfast: eggs, cheese, thin Iraqi bread, milk Snack (morning): only if low BG, fresh fruit Lunch: chicken, bread, occasionally rice, water Snack (afternoon): only if low, cake and juice Dinner: lighter meal, small sandwich, water Snack (evening): only if low BG Beverage(s): milk, water, fruit juice to treat low BG  Exercise:  Exercise: ADL's (active job, doesn't sit all day)  Individualized Plan for Diabetes Self-Management Training:   Learning Objective:  Patient will have a greater understanding of diabetes  self-management.  Patient education plan per assessed needs and concerns is to attend individual sessions for     Education Topics Reviewed with Patient Today:    Carbohydrate counting;Role of diet in the treatment of diabetes and the relationship between the three main macronutrients and blood glucose level (using food models)   Reviewed patients medication for diabetes, action, purpose, timing of dose and side effects. Identified appropriate SMBG and/or A1C goals. Taught treatment of hypoglycemia - the 15 rule.          PATIENTS GOALS/Plan (Developed by the patient):  Nutrition: Follow meal plan discussed Physical Activity: Exercise 3-5 times per week Medications: take my medication as prescribed Monitoring : test blood glucose pre and post meals as discussed  Plan:   Patient Instructions  Plan: Consider identifying your foods by Starch, Fruit or Milk as containing carbohydrate   Expected Outcomes:  Demonstrated interest in learning. Expect positive outcomes  Education material provided: Living Well with Diabetes, Meal plan card and My Plate  If problems or questions, patient to contact team via:  Phone  Future DSME appointment: PRN

## 2014-05-07 ENCOUNTER — Encounter: Payer: Medicaid Other | Admitting: *Deleted

## 2014-05-10 ENCOUNTER — Ambulatory Visit: Payer: Medicaid Other | Admitting: Internal Medicine

## 2014-05-11 ENCOUNTER — Telehealth: Payer: Self-pay | Admitting: Internal Medicine

## 2014-05-11 NOTE — Telephone Encounter (Signed)
1-1.5 mo 

## 2014-05-11 NOTE — Telephone Encounter (Signed)
Patient no showed today's appt. Please advise on how to follow up. °A. No follow up necessary. °B. Follow up urgent. Contact patient immediately. °C. Follow up necessary. Contact patient and schedule visit in ___ days. °D. Follow up advised. Contact patient and schedule visit in ____weeks. ° °

## 2014-05-14 ENCOUNTER — Encounter: Payer: Medicaid Other | Admitting: *Deleted

## 2014-05-14 ENCOUNTER — Encounter: Payer: Self-pay | Admitting: *Deleted

## 2014-05-14 VITALS — Ht 66.0 in | Wt 125.2 lb

## 2014-05-14 DIAGNOSIS — E1065 Type 1 diabetes mellitus with hyperglycemia: Secondary | ICD-10-CM | POA: Diagnosis not present

## 2014-05-14 DIAGNOSIS — IMO0002 Reserved for concepts with insufficient information to code with codable children: Secondary | ICD-10-CM

## 2014-05-14 NOTE — Progress Notes (Deleted)
Diabetes Self-Management Education  Visit Type:   follow up  Appt. Start Time: 0815 Appt. End Time: 0845  05/14/2014  [name], identified by name and date of birth, is a 28 y.o. male with a diagnosis of Diabetes: Type 1.  Other people present during visit:  Patient;Interpreter (Interpretor here: Barry Potter)   ASSESSMENT  Height 5\' 6"  (1.676 m), weight 125 lb 3.2 oz (56.79 kg). Body mass index is 20.22 kg/(m^2).    Subsequent Visit Information:  Since your last visit, have you continued or began the use of a meal plan?: Yes How many days a week are you following a meal plan?: 7 Since your last visit have you experienced any weight changes?: No change Since your last visit, are you checking your blood glucose at least once a day?: Yes  Psychosocial:     Patient Belief/Attitude about Diabetes: Motivated to manage diabetes Self-care barriers: [place] a second language Insurance claims handler(Interpretor here: Barry CromerFatima Potter) Self-management support: Doctor's office;CDE visits Other persons present: Patient;Interpreter Insurance claims handler(Interpretor here: Publishing copyatima Potter) Patient Concerns: Nutrition/Meal planning Special Needs: Verbal instruction Learning Readiness: Change in progress  Complications:   Last HgB A1C per patient/outside source: 7.0%  Diet Intake:  Breakfast: eggs, bread, tea with 2 tsp sugar Snack (morning): only to treat low BG; fruit juice or banana Lunch: has similar lunch each day Dinner: light meal,  tomato  sandwich or chicken pizza, water Snack (evening): only if BG is too low  Exercise:      Individualized Plan for Diabetes Self-Management Training:   Learning Objective:  Patient will have a greater understanding of diabetes self-management.  Patient education plan per assessed needs and concerns is to attend individual sessions for    Education Topics Reviewed with Patient Today:                       PATIENTS GOALS/Plan (Developed by the  patient):  Nutrition: Follow meal plan discussed (He expresses good understanding of which foods contain carbohydrate and which ones do not. Also, good understanding of serving sizes and carb content of the foods he typically eats.)  Patient Self Evaluation of Goals - Patient rates self as meeting previously set goals:   Nutrition: >75%   Plan:   There are no Patient Instructions on file for this visit.   Expected Outcomes:  Demonstrated interest in learning. Expect positive outcomes  Education material provided: {CHL AMB DSME EDUCATION MATERIAL:22611}  If problems or questions, patient to contact team via:  {TYPE OF CONTACT:20355}  Future DSME appointment: - PRN

## 2014-05-14 NOTE — Progress Notes (Signed)
Diabetes Self-Management Education  Visit Type:   Follow Up  Appt. Start Time: 0815 Appt. End Time: 0845  05/14/2014  [name], identified by name and date of birth, is a 28 y.o. male with a diagnosis of Diabetes: Type 1.  Other people present during visit:  Patient;Interpreter (Interpretor here: Fatima Dafaalla)   ASSESSMENT  Height 5\' 6"  (1.676 m), weight 125 lb 3.2 oz (56.79 kg). Body mass index is 20.22 kg/(m^2).    Subsequent Visit Information:  Since your last visit, have you continued or began the use of a meal plan?: Yes How many days a week are you following a meal plan?: 7 Since your last visit have you experienced any weight changes?: No change Since your last visit, are you checking your blood glucose at least once a day?: Yes  Psychosocial:     Patient Belief/Attitude about Diabetes: Motivated to manage diabetes Self-care barriers: [place] as a second language Insurance claims handler(Interpretor here: Gregary CromerFatima Dafaalla) Self-management support: Doctor's office;CDE visits Other persons present: Patient;Interpreter Insurance claims handler(Interpretor here: Publishing copyatima Dafaalla) Patient Concerns: Nutrition/Meal planning Special Needs: Verbal instruction Learning Readiness: Change in progress  Complications:   Last HgB A1C per patient/outside source: 7 %  Diet Intake:  Breakfast: eggs, bread, tea with 2 tsp sugar Snack (morning): only to treat low BG; fruit juice or banana Lunch: has similar lunch each day Dinner: light meal,  tomato  sandwich or chicken pizza, water Snack (evening): only if BG is too low  Exercise:      Individualized Plan for Diabetes Self-Management Training:   Learning Objective:  Patient will have a greater understanding of diabetes self-management.  Patient education plan per assessed needs and concerns is to attend individual sessions for    Education Topics Reviewed with Patient Today:    Carbohydrate counting;Food label reading, portion sizes and measuring food. (He  expressed good understanding of carb counting taught at last visit )                  PATIENTS GOALS/Plan (Developed by the patient):  Nutrition: Follow meal plan discussed (He expresses good understanding of which foods contain carbohydrate and which ones do not. Also, good understanding of serving sizes and carb content of the foods he typically eats.)  Patient Self Evaluation of Goals - Patient rates self as meeting previously set goals:   Nutrition: >75%   Plan:   There are no Patient Instructions on file for this visit.   Expected Outcomes:  Demonstrated interest in learning. Expect positive outcomes  Education material provided: Food label handouts  If problems or questions, patient to contact team via:  Phone and Email  Future DSME appointment: - PRN

## 2014-05-15 ENCOUNTER — Encounter: Payer: Self-pay | Admitting: Internal Medicine

## 2014-05-15 ENCOUNTER — Ambulatory Visit (INDEPENDENT_AMBULATORY_CARE_PROVIDER_SITE_OTHER): Payer: Medicaid Other | Admitting: Internal Medicine

## 2014-05-15 VITALS — BP 116/68 | HR 71 | Temp 98.1°F | Resp 12 | Wt 125.0 lb

## 2014-05-15 DIAGNOSIS — IMO0002 Reserved for concepts with insufficient information to code with codable children: Secondary | ICD-10-CM

## 2014-05-15 DIAGNOSIS — Z23 Encounter for immunization: Secondary | ICD-10-CM

## 2014-05-15 DIAGNOSIS — R208 Other disturbances of skin sensation: Secondary | ICD-10-CM

## 2014-05-15 DIAGNOSIS — E1065 Type 1 diabetes mellitus with hyperglycemia: Secondary | ICD-10-CM

## 2014-05-15 LAB — LIPID PANEL
CHOLESTEROL: 109 mg/dL (ref 0–200)
HDL: 51.5 mg/dL (ref >39.00)
LDL Cholesterol: 45 mg/dL (ref 0–99)
NonHDL: 57.5
TRIGLYCERIDES: 64 mg/dL (ref 0.0–149.0)
Total CHOL/HDL Ratio: 2
VLDL: 12.8 mg/dL (ref 0.0–40.0)

## 2014-05-15 LAB — VITAMIN B12: VITAMIN B 12: 468 pg/mL (ref 211–911)

## 2014-05-15 LAB — HEMOGLOBIN A1C: Hgb A1c MFr Bld: 6.6 % — ABNORMAL HIGH (ref 4.6–6.5)

## 2014-05-15 MED ORDER — INSULIN ASPART 100 UNIT/ML FLEXPEN: 5.0000 [IU] | PEN_INJECTOR | Freq: Three times a day (TID) | SUBCUTANEOUS | Status: DC

## 2014-05-15 MED ORDER — INSULIN GLARGINE 100 UNIT/ML SOLOSTAR PEN: 22.0000 [IU] | PEN_INJECTOR | Freq: Every day | SUBCUTANEOUS | Status: DC

## 2014-05-15 MED ORDER — ACCU-CHEK SOFTCLIX LANCETS MISC: Status: DC

## 2014-05-15 MED ORDER — GLUCOSE BLOOD VI STRP: ORAL_STRIP | Status: DC

## 2014-05-15 NOTE — Progress Notes (Signed)
Patient ID: Barry Potter, male   DOB: 1986-06-03, 28 y.o.   MRN: [id number]  HPI: Barry Potter is a 28 y.o.-year-old male, initially referred by his PCP, Dr. Bridgett Larsson, for management of DM1, dx 2002, uncontrolled, with complications (mild DR). He returns for f/u. Last visit 1 mo ago.  Last hemoglobin A1c was: Lab Results  Component Value Date   HGBA1C 7.0 12/04/2013   Pt was on: - Humulin NPH 30 units in am and 20 units before dinner This was changed at he time he came to [place] from [place] 07/2013.  He is on: Lantus to 25 units in hs  NovoLog  - 8 units with large meal  - 6 units with regular meal  - 4-5 units with small meal   B'fast: 4-5 units Lunch: 6-8 units Dinner: 4  He was only taking the basal insulin despite multiple attempts at adding mealtime insulin, but at this visit, he is finally taking the basal-bolus regimen as advised.   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  Pt checks his sugars 2-4x a day and they are better, but has more lows: - am: 60-350 >> 48-135 >> 41-177 >> 68-185 >> 50-110, 152, 185 - 2h after b'fast: n/c >> 60-200 >> n/c >> 61-271 >> 56-150, 170 - before lunch: n/c >> 65-185 >> 61-214 >> 63-192 >> 93-125, 155 - 2h after lunch: n/c >> 170-200 >> 53-159 (277x1) >> 73-165, 220 >> 102-185, 190 - before dinner: n/c >> 71-175 >> 75, 147 >> 139-242 >> 60-109, 172, 187 - 2h after dinner: [place] >> 110-200 >> 64-208 >> 768-240, 299 >> 51, 55, 103-167 - bedtime: 130-150 >> 120-190 >> 86-125 >> 131-203, 294 >> n/c - nighttime: n/c >> 60-120 >> n/c >> 49, 270 >> n/c No lows. Lowest sugar was 49 >> 50; he has hypoglycemia awareness at 60. No previous hypoglycemia admission.  Does have a glucagon kit at home. Highest sugar was 299 >> 190. No previous DKA admissions. He was admitted in 2005 for high sugars ? DKA.  Pt's meals are: - am: egg, tea, bread - largest meal of the day - lunch: rice, meat, salad - dinner: yoghurt, salad, rice - smallest meal  of the day - Snacks: 3x or more  He works 6 am-6 pm.  - no CKD, last BUN/creatinine:  Lab Results  Component Value Date   BUN 14 12/04/2013   CREATININE 0.84 12/04/2013  - last eye exam was in 11/2013. + R DR - mild (reportedly) - no numbness and tingling in his feet.  I reviewed pt's medications, allergies, PMH, social hx, family hx and no changes required, except as mentioned above.  ROS: Constitutional: no weight loss, no decreased appetite, no fatigue, no subjective hyperthermia/hypothermia Eyes:+ blurry vision, no xerophthalmia ENT: no sore throat, no nodules palpated in throat, no dysphagia/no odynophagia, no hoarseness Cardiovascular: no CP/SOB/palpitations/leg swelling Respiratory: no cough/SOB Gastrointestinal: no N/V/D/C Musculoskeletal: no muscle/joint aches Skin: no rashes Neurological: no tremors/numbness/tingling/dizziness, + tingling fingers and toes  PE: BP 116/68  Pulse 71  Temp(Src) 98.1 F (36.7 C) (Oral)  Resp 12  Wt 125 lb (56.7 kg)  SpO2 97% Wt Readings from Last 3 Encounters:  05/15/14 125 lb (56.7 kg)  05/14/14 125 lb 3.2 oz (56.79 kg)  04/25/14 125 lb 11.2 oz (57.017 kg)   Constitutional: thin, in NAD Eyes: PERRLA, EOMI, no exophthalmos ENT: moist mucous membranes, no thyromegaly, no cervical lymphadenopathy Cardiovascular: RRR, No MRG  Respiratory: CTA B Gastrointestinal: abdomen soft, NT, ND, BS+ Musculoskeletal: no deformities, strength intact in all 4 Skin: moist, warm, no rashes Neurological: no tremor with outstretched hands, DTR normal in all 4  ASSESSMENT: 1. DM1, uncontrolled, without complications He is interested in a pump - discussed how the pump works - he would like to get this before his [place] runs out in 04/2014.  2. Numbness fingers and toes  PLAN:  1. Patient with long standing DM1, on basal-bolus insulin therapy. Sugars much better! He has some lows >> will reduce Lantus dose: Patient Instructions  Please decrease  Lantus to 22 units at bedtime NovoLog  - 7-8 units with large meal  - 6 units with regular meal  - 4-5 units with small meal   Please return in 1.5 months with your sugar log.   Please stop at the lab.  Please schedule an appointment with Vaughan Basta when you get the pump.  - continue checking sugars at different times of the day - check >4 times a day, rotating checks - check HbA1c today, a B12 and a cholesterol panel - we will give the flu vaccine today - referred him to prepump training (had this after our visit), also need referral to nutrition for carb counting - his pump is ready to be shipped >> referred to Charlynn Court for pump start appt. I d/w with her about him during the appt >> will go ahead and attach the pump as soon as this is shipped. - Return to clinic in 1.5 mo with sugar log   2. Numbness in fingers, toes  - needs a B12 check >> will check today  Office Visit on 05/15/2014  Component Date Value Ref Range Status  . Hemoglobin A1C 05/15/2014 6.6* 4.6 - 6.5 % Final   Glycemic Control Guidelines for People with Diabetes:Non Diabetic:  <6%Goal of Therapy: <7%Additional Action Suggested:  >8%   . Vitamin B-12 05/15/2014 468  211 - 911 pg/mL Final  . Cholesterol 05/15/2014 109  0 - 200 mg/dL Final   ATP III Classification       Desirable:  < 200 mg/dL               Borderline High:  200 - 239 mg/dL          High:  > = 240 mg/dL  . Triglycerides 05/15/2014 64.0  0.0 - 149.0 mg/dL Final   Normal:  <150 mg/dLBorderline High:  150 - 199 mg/dL  . HDL 05/15/2014 51.50  >39.00 mg/dL Final  . VLDL 05/15/2014 12.8  0.0 - 40.0 mg/dL Final  . LDL Cholesterol 05/15/2014 45  0 - 99 mg/dL Final  . Total CHOL/HDL Ratio 05/15/2014 2   Final                  Men          Women1/2 Average Risk     3.4          3.3Average Risk          5.0          4.42X Average Risk          9.6          7.13X Average Risk          15.0          11.0                      . NonHDL  05/15/2014 57.50   Final    NOTE:  Non-HDL goal should be 30 mg/dL higher than patient's LDL goal (i.e. LDL goal of < 70 mg/dL, would have non-HDL goal of < 100 mg/dL)   All labs are great: HbA1c, Lipids and vit B12.

## 2014-05-15 NOTE — Patient Instructions (Addendum)
Please decrease Lantus to 22 units at bedtime NovoLog  - 7-8 units with large meal  - 6 units with regular meal  - 4-5 units with small meal   Please return in 1.5 months with your sugar log.   Please stop at the lab.  Please schedule an appointment with Bonita QuinLinda when you get the pump.

## 2014-05-16 ENCOUNTER — Encounter: Payer: Self-pay | Admitting: *Deleted

## 2014-05-22 ENCOUNTER — Telehealth: Payer: Self-pay | Admitting: Nutrition

## 2014-05-22 ENCOUNTER — Encounter: Payer: Medicaid Other | Admitting: Nutrition

## 2014-05-22 NOTE — Telephone Encounter (Signed)
The patient came in today to begin his insulin pump, but did not get it in the mail as yet.  He said that he was called and it was coming this Wednesday.  The interpreter could not meet this Wednesday to begin his insulin pump, so his appointment was changed to next Monday at 8AM.  He was told to stop his Lantus insulin the Sunday night, to eat breakfast Monday morning and take his Novolog, and then come in to start his pump.  They both reported good understanding of this.

## 2014-05-28 ENCOUNTER — Encounter: Payer: Medicaid Other | Attending: Internal Medicine | Admitting: Nutrition

## 2014-05-28 DIAGNOSIS — E1065 Type 1 diabetes mellitus with hyperglycemia: Secondary | ICD-10-CM

## 2014-05-28 DIAGNOSIS — IMO0002 Reserved for concepts with insufficient information to code with codable children: Secondary | ICD-10-CM

## 2014-05-28 NOTE — Progress Notes (Signed)
Pt. Is here today to begin pump therapy.  He brought in a box from [facility], but it contained only infusion sets.  I phone Roxann Ripple, and she said the pump shipped out on Wednesday.  She will call them when their office opens in [place] and get back to me.  The patient said that he did not receive any other boxes in the mail.   He did not take his 25u of Lantus last night, and so was given 12u of Lantus now (8:30 AM) per Dr. Charlean SanfilippoGherghe's voice order.  He was told to take his Lantus 25u tonight.  Blood sugar fasting today was 228.  He did not eat breakfast, nor take any fasting acting insulin.  The 12u order was re verbalized, and agreed to by Dr. Elvera LennoxGherghe. The interpreter will call me when the patient receives his pump from [employer].  We will reschedule his pump start after that.

## 2014-05-28 NOTE — Patient Instructions (Signed)
Call interpreter when you receive the pump from [employer]

## 2014-06-11 NOTE — Telephone Encounter (Signed)
Pt has not received his pump yet?

## 2014-06-25 NOTE — Telephone Encounter (Signed)
I phoned the pump rep. On 11/3 and she says there is a problem with the Arabic manual.  He knows to call me when it comes in.

## 2014-06-26 ENCOUNTER — Ambulatory Visit: Payer: Medicaid Other | Admitting: Internal Medicine

## 2014-07-23 ENCOUNTER — Other Ambulatory Visit: Payer: Self-pay | Admitting: Internal Medicine

## 2014-08-01 ENCOUNTER — Encounter: Payer: Self-pay | Admitting: Internal Medicine

## 2014-08-01 ENCOUNTER — Other Ambulatory Visit: Payer: Self-pay

## 2014-08-01 ENCOUNTER — Ambulatory Visit (INDEPENDENT_AMBULATORY_CARE_PROVIDER_SITE_OTHER): Payer: Medicaid Other | Admitting: Internal Medicine

## 2014-08-01 VITALS — BP 102/64 | HR 80 | Temp 98.2°F | Resp 12 | Wt 130.0 lb

## 2014-08-01 DIAGNOSIS — IMO0002 Reserved for concepts with insufficient information to code with codable children: Secondary | ICD-10-CM

## 2014-08-01 DIAGNOSIS — E1065 Type 1 diabetes mellitus with hyperglycemia: Secondary | ICD-10-CM

## 2014-08-01 MED ORDER — GLUCOSE BLOOD VI STRP: ORAL_STRIP | Status: DC

## 2014-08-01 MED ORDER — INSULIN LISPRO 100 UNIT/ML (KWIKPEN): PEN_INJECTOR | SUBCUTANEOUS | Status: DC

## 2014-08-01 MED ORDER — INSULIN DETEMIR 100 UNIT/ML FLEXPEN: PEN_INJECTOR | SUBCUTANEOUS | Status: DC

## 2014-08-01 NOTE — Patient Instructions (Signed)
Patient Instructions  Please switch Lantus to Levemir to 22 units at bedtime Change from NovoLog to Humalog: - 7-8 units with large meal  - 6 units with regular meal  - 4-5 units with small meal   Please return in 1.5 months with your sugar log.   Please schedule an appointment with Bonita QuinLinda when you get the pump - [phone].

## 2014-08-01 NOTE — Progress Notes (Signed)
Patient ID: Barry Potter, male   DOB: 1986/07/08, 29 y.o.   MRN: [id number]  HPI: Barry Potter is a 29 y.o.-year-old male, initially referred by his PCP, Dr. Bridgett Larsson, for management of DM1, dx 2002, uncontrolled, with complications (mild DR). He returns for f/u. Last visit 2.5 mo ago.  He now has insurance: [employer].  Last hemoglobin A1c was: Lab Results  Component Value Date   HGBA1C 6.6* 05/15/2014   HGBA1C 7.0 12/04/2013   Pt was on: - Humulin NPH 30 units in am and 20 units before dinner This was changed at he time he came to [place] from [place] 07/2013.  He is on: Lantus 25 units in hs >> he could only get this from the pharmacy (he increased from the advised 22 units) NovoLog >> could not afford - off for last 2 weeks! 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  Pt checks his sugars 2-4x a day and they are higher towards the end of the day - off NovoLog x 2 weeks: - am: 60-350 >> 48-135 >> 41-177 >> 68-185 >> 50-110, 152, 185 >> 52-122, 198 - 2h after b'fast: n/c >> 60-200 >> n/c >> 61-271 >> 56-150, 170 >> 130-174 - before lunch: n/c >> 65-185 >> 61-214 >> 63-192 >> 93-125, 155 >> 61-197 - 2h after lunch: n/c >> 170-200 >> 53-159 (277x1) >> 73-165, 220 >> 102-185, 190 >> 94-217 - before dinner: n/c >> 71-175 >> 75, 147 >> 139-242 >> 60-109, 172, 187 >> 91-241, 290 - 2h after dinner: [place] >> 110-200 >> 64-208 >> 768-240, 299 >> 51, 55, 103-167 >> 67-293 - bedtime: 130-150 >> 120-190 >> 86-125 >> 131-203, 294 >> n/c >> 46, 59-170 - nighttime: n/c >> 60-120 >> n/c >> 49, 270 >> n/c >> 107-126, had 1x222 >> corrected  >> 2h later: 35 No lows. Lowest sugar was 35 (post correction); he has hypoglycemia awareness at 60.  No previous hypoglycemia admission. Does have a glucagon kit at home. Highest sugar was 348x1.  No  previous DKA admissions. He was admitted in 2005 for high sugars ? DKA.  Pt's meals are: - am: egg, tea, bread - largest meal of the day - lunch: rice, meat, salad - dinner: yoghurt, salad, rice - smallest meal of the day - Snacks: 3x or more  He works 6 am-6 pm.  - no CKD, last BUN/creatinine:  Lab Results  Component Value Date   BUN 14 12/04/2013   CREATININE 0.84 12/04/2013  - last eye exam was in 11/2013. + R DR - mild (reportedly) - no numbness and tingling in his feet.  I reviewed pt's medications, allergies, PMH, social hx, family hx, and changes were documented in the history of present illness. Otherwise, unchanged from my initial visit note.  ROS: Constitutional: + weight loss, no decreased appetite, + fatigue, no subjective hyperthermia/hypothermia Eyes: no blurry vision, no xerophthalmia ENT: no sore throat, no nodules palpated in throat, no dysphagia/no odynophagia, no hoarseness Cardiovascular: no CP/SOB/palpitations/leg swelling Respiratory: no cough/SOB Gastrointestinal: no N/V/D/+ C Musculoskeletal: + muscle aches/no joint aches Skin: no rashes, + hair loss Neurological: no tremors/numbness/tingling/dizzines  PE: BP 102/64 mmHg  Pulse 80  Temp(Src) 98.2 F (36.8 C) (Oral)  Resp 12  Wt 130 lb (58.968 kg)  SpO2  98% Wt Readings from Last 3 Encounters:  08/01/14 130 lb (58.968 kg)  05/15/14 125 lb (56.7 kg)  05/14/14 125 lb 3.2 oz (56.79 kg)   Constitutional: thin, in NAD Eyes: PERRLA, EOMI, no exophthalmos ENT: moist mucous membranes, no thyromegaly, no cervical lymphadenopathy Cardiovascular: RRR, No MRG Respiratory: CTA B Gastrointestinal: abdomen soft, NT, ND, BS+ Musculoskeletal: no deformities, strength intact in all 4 Skin: moist, warm, no rashes Neurological: no tremor with outstretched hands, DTR normal in all 4  ASSESSMENT: 1. DM1, uncontrolled, without complications  2. Numbness fingers and toes  PLAN:  1. Patient with long standing  DM1, on basal-bolus insulin therapy. Sugars higher later in the day as he has been off the NovoLog for last 2 weeks >> did not afford it. Since he changed insurance from [facility] to [facility] >> will need to change to Levemir and Humalog >> given him new Rx's for pens. He also cannot afford the test strips >> I sent a new Rx for the AccuCheck Aviva which are preferred by [facility] >> if too expensive, he needs to buy the ReliOn meter from [place].  - his insulin pump infusion sets were shipped before the [place] ran out, but not the pump as the company was waiting for the Arabic manual. Since then, his [place] ended and he was not sent the pump anymore. We contacted the company ([employer]) many times to send the pump w/out result. We called them again today and explained that he does have a new insurance. The rep promised the pump will be shipped ASAP. As soon as the pump comes, he needs to schedule an appt with DM Edu Leonia Reader) to have it attached. - Until then: Patient Instructions  Please switch Lantus to Levemir to 22 units at bedtime Change from NovoLog to Humalog: - 7-8 units with large meal  - 6 units with regular meal  - 4-5 units with small meal   Please return in 1.5 months with your sugar log.   Please schedule an appointment with Vaughan Basta when you get the pump - [phone].  - continue checking sugars at different times of the day - check >4 times a day, rotating checks - had flu vaccine at last visit - referred him to prepump training (had this) - Return to clinic in 1.5 mo with sugar log   2. Numbness in fingers, toes  - B12 normal at last check  - time spent with the patient: 40 min, of which >50% was spent in reviewing his meter download, discussing his hypo- and hyper-glycemic episodes, reviewing  previous labs and insulin doses,  developing a plan to avoid hypo- and hyper-glycemia, and coordinating his care.

## 2014-09-10 ENCOUNTER — Telehealth: Payer: Self-pay | Admitting: Internal Medicine

## 2014-09-10 NOTE — Telephone Encounter (Signed)
Crystal from [employer] stated that she need clarification on insulin pump, phone # [phone]

## 2014-09-10 NOTE — Telephone Encounter (Signed)
Bonita QuinLinda, please read message below.

## 2014-09-11 NOTE — Telephone Encounter (Signed)
I spoke with Barry Potter and told her it was ok to send the 22 series of pump.  Roxanne from [employer] asked me to call her to tell her that [employer] can only send the 22 series.

## 2014-09-19 ENCOUNTER — Encounter: Payer: 59 | Admitting: Nutrition

## 2014-09-25 ENCOUNTER — Encounter: Payer: 59 | Attending: Internal Medicine | Admitting: Nutrition

## 2014-09-25 DIAGNOSIS — Z713 Dietary counseling and surveillance: Secondary | ICD-10-CM | POA: Insufficient documentation

## 2014-09-25 DIAGNOSIS — E1065 Type 1 diabetes mellitus with hyperglycemia: Secondary | ICD-10-CM | POA: Insufficient documentation

## 2014-09-25 DIAGNOSIS — Z794 Long term (current) use of insulin: Secondary | ICD-10-CM | POA: Insufficient documentation

## 2014-09-25 DIAGNOSIS — IMO0002 Reserved for concepts with insufficient information to code with codable children: Secondary | ICD-10-CM

## 2014-09-26 ENCOUNTER — Encounter: Payer: 59 | Admitting: Nutrition

## 2014-09-26 DIAGNOSIS — IMO0002 Reserved for concepts with insufficient information to code with codable children: Secondary | ICD-10-CM

## 2014-09-26 DIAGNOSIS — E1065 Type 1 diabetes mellitus with hyperglycemia: Secondary | ICD-10-CM

## 2014-09-26 NOTE — Patient Instructions (Addendum)
Test blood sugars before meals, 2hr. After meals, bedtime, and 3AM.  Call the office if blood sugars drop low, or remain high before 5 PM tonight

## 2014-09-26 NOTE — Progress Notes (Signed)
[name] was instructed on the [employer] 522 insulin pump.  He reports that he took his Lantus last night as directed and again this morning he took 15 extra units.  His blood sugar is now 225.  With the help of the interpreter Barry Potter, he was instructed on how to fill a reservoir, attach a Mio 6mm infusion set, and he inserted it into his abdominal area without any difficulty.  He filled the reservoir with Novolog insulin.  His pump settings were per as Barry Potter's orders:  Basal rate 0.9u/hr,  Bolus calculations: I/C 15, sensitivitiy: 50, timing 4 hours, target 120-120.  We reviewed what each one of these mean, and how to change them. I believe he will need more work with changing these settings--where to find them in the pump, and how to changes them.   He was instructed on how to use the Bayer meter, and we set it up so that the readings go to the pump.  He re demonstrated X4 how to give a bolus correctly, and he is fairly accurate in his carb counting.  We reviewed this again, and he was shown 15 gram portions of juice, milk, sweet tea--all of which he drinks, and fruit, and starches.    He was given a log sheet to record his blood sugars, and was told to test ac, 2hr. Pc, HS and 3AM.  His pump was set to alarm for the 2 hours pc readings, and he was shown how to shut this alarm off, when it occurs.  He had no final questions. I will call him tonight at 10PM, ( 2hr.pcS), to see how he is doing.  He was told to call here if blood sugar remain high, or drop low before 5PM today.  He agreed to do this.  His pump was not put into a basal reduction mode per Barry Potter's order.  This was reveralized to her .    We reviewed what to do if blood sugars drop low.  He was given a packaged of glucose tablets to use for this, as well as other suggestions that he can use to treat this.

## 2014-09-27 ENCOUNTER — Other Ambulatory Visit: Payer: Self-pay | Admitting: *Deleted

## 2014-09-27 MED ORDER — INSULIN ASPART 100 UNIT/ML ~~LOC~~ SOLN: SUBCUTANEOUS | Status: DC

## 2014-09-27 MED ORDER — GLUCOSE BLOOD VI STRP: ORAL_STRIP | Status: DC

## 2014-09-27 NOTE — Telephone Encounter (Signed)
Per Bonita QuinLinda, ordered Novolog for pt's insulin pump and test strips.

## 2014-09-28 ENCOUNTER — Other Ambulatory Visit: Payer: Self-pay | Admitting: *Deleted

## 2014-09-28 MED ORDER — INSULIN LISPRO 100 UNIT/ML ~~LOC~~ SOLN: SUBCUTANEOUS | Status: DC

## 2014-09-28 NOTE — Telephone Encounter (Signed)
Pt's ins denied Novolog. Switching pt to Humalog.

## 2014-10-01 NOTE — Progress Notes (Signed)
Patient is here today with intrepreter Gwynneth AlbrightSara Gabralla. Patient reported no difficulty giving self bolus last night or this AM.   Reviewed what we had covered yesterday, and discussed the idea of IOB, and temp basal rates--when and how to use them.  He redemonstrated how to put the pump in temp basal and then canceled it without any assistance from me.  He did a cartridge and infusion set change with almost no assistance from me.  We switched the pump to read Arabic, we reviewed the different menues again, and what followed on under each menue.  He reported good understanding of this.  His blood sugars were acS: 134, and 2hr. PcS: 72.  He drank a 4 ounce glass of juice a that time.  He was told to have a 15 gram snack without bolusing, but he says that he did not do this.  FBS today was 70.  He then ate his breakfast and took his bolus.  His 2hr. pcB reading was 228.  We discussed the protocol for what to do when blood sugars ar low before meals--give 15 grams of carb, wait 15 min., retest, and then give bolus and eat.  Discussed also the need to give insulin for all meals/snacks except for those that are treating a low blood sugar.  He reported good understanding of this. He was told to call in his blood sugar readings on Friday, and an appt. Was made for next Wednesday to review high blood sugar protocols, review readings, and sigh off on all topics covered.

## 2014-10-01 NOTE — Patient Instructions (Signed)
Continue to test before meals, 2hr. After meals and at bedtime. Call if questions.

## 2014-10-03 ENCOUNTER — Encounter: Payer: 59 | Admitting: Nutrition

## 2014-10-03 DIAGNOSIS — E1065 Type 1 diabetes mellitus with hyperglycemia: Secondary | ICD-10-CM

## 2014-10-03 DIAGNOSIS — IMO0002 Reserved for concepts with insufficient information to code with codable children: Secondary | ICD-10-CM

## 2014-10-03 NOTE — Progress Notes (Signed)
Patient is here with interpreter Virgel BouquetAli Jameel.   Pump download shows patient changed his infusion set after 3 days, but did not do a fixed prime.  We reviewed this, and he reported good understanding of this, with no questions.   He has some lows, due to an overcorrection dose.  Sensitivity changed from 50 to 60 per Dr. Lavella HammockGherge's order.  We changed his target to 115 and his basal rate was dropped during his work hours from noon to 6 PM by 0.1u/hr (0.8).   We reviewed high blood sugar protocols, and all of the alarms.  We also reviewed temp basal rates--when and how to do this, and also how to cancel it.    He reports that his only problem is that he is having trouble inserting a infusion set.  He has wasted 4 of them.  We reviewed this, and he inserted one with no difficulty.    He had no final questions.  He signed off on understanding all topics and he will see Dr. Elvera LennoxGherghe in 2 weeks.  He was told to call if blood sugars remain high, or drop low.  He agreed to do this.

## 2014-10-03 NOTE — Patient Instructions (Signed)
Test before meals, and at bedtime Call if blood sugars drop low, or remain high.

## 2014-10-09 ENCOUNTER — Ambulatory Visit (INDEPENDENT_AMBULATORY_CARE_PROVIDER_SITE_OTHER): Payer: 59 | Admitting: Physician Assistant

## 2014-10-09 VITALS — BP 110/72 | HR 71 | Temp 97.9°F | Resp 18 | Ht 66.0 in | Wt 126.0 lb

## 2014-10-09 DIAGNOSIS — Z7189 Other specified counseling: Secondary | ICD-10-CM

## 2014-10-09 DIAGNOSIS — Z7185 Encounter for immunization safety counseling: Secondary | ICD-10-CM

## 2014-10-09 NOTE — Progress Notes (Signed)
Patient asked to come in by [facility] staff for a letter stating that he may receive immunizations from the [facility].  Letter written. Copay refunded to patient.  Deliah BostonMichael Blaike Vickers, MS, PA-C   2:18 PM, 10/09/2014

## 2014-10-10 ENCOUNTER — Encounter: Payer: 59 | Admitting: Nutrition

## 2014-10-10 ENCOUNTER — Telehealth: Payer: Self-pay | Admitting: Internal Medicine

## 2014-10-10 DIAGNOSIS — E1065 Type 1 diabetes mellitus with hyperglycemia: Secondary | ICD-10-CM

## 2014-10-10 DIAGNOSIS — Z713 Dietary counseling and surveillance: Secondary | ICD-10-CM | POA: Diagnosis not present

## 2014-10-10 DIAGNOSIS — IMO0002 Reserved for concepts with insufficient information to code with codable children: Secondary | ICD-10-CM

## 2014-10-10 DIAGNOSIS — Z794 Long term (current) use of insulin: Secondary | ICD-10-CM | POA: Diagnosis not present

## 2014-10-10 NOTE — Progress Notes (Signed)
Pump down shown to Dr. Wyonia HoughGerghe Settings changed:  I/C ratio changed at bfast and supper from 15 to 12, and ISF changed from 60 to 70.  He was given my card and told to call me on Monday with blood sugar readings.  He agreed to do this.

## 2014-10-10 NOTE — Telephone Encounter (Signed)
Called patient.  He is coming in today to download his pump to view blood sugar readings

## 2014-10-10 NOTE — Telephone Encounter (Signed)
Pt is having very high blood sugars after midnight can you please call him and help

## 2014-10-15 ENCOUNTER — Ambulatory Visit: Payer: 59 | Admitting: Nutrition

## 2014-10-15 ENCOUNTER — Telehealth: Payer: Self-pay | Admitting: Endocrinology

## 2014-10-15 MED ORDER — INSULIN LISPRO 100 UNIT/ML ~~LOC~~ SOLN: SUBCUTANEOUS | Status: DC

## 2014-10-15 NOTE — Telephone Encounter (Signed)
Rx for Humalog sent to the other pharmacy listed in pt's chart. Resending rx for Humalog (ins will not cover Novolog) to [facility] [address]

## 2014-10-15 NOTE — Telephone Encounter (Signed)
OK to use whichever is covered by insurance.

## 2014-10-15 NOTE — Telephone Encounter (Signed)
Team health note dated 10/12/14 at 1:29 pm Medication Question (non symptomatic) Initial Comment Caller states her cousin got his insulin at the pharmacy and got Humalog instead of Novalin, because the insurance doesn't cover one of the insulins. Will it be ok to use the Humalog? Caller states patient has an insulin pump and the doctor prescribed Novalin and his insurance will not cover it and the pharmacy gave him a different one and he thinks it will not be working for him Caller instructed that I will call the pharmacy about this issue

## 2014-10-17 ENCOUNTER — Ambulatory Visit: Payer: 59 | Admitting: Nutrition

## 2014-10-17 ENCOUNTER — Encounter: Payer: 59 | Admitting: Nutrition

## 2014-10-17 ENCOUNTER — Ambulatory Visit: Payer: 59 | Admitting: Endocrinology

## 2014-10-17 DIAGNOSIS — E1065 Type 1 diabetes mellitus with hyperglycemia: Secondary | ICD-10-CM

## 2014-10-17 DIAGNOSIS — IMO0002 Reserved for concepts with insufficient information to code with codable children: Secondary | ICD-10-CM

## 2014-10-17 NOTE — Progress Notes (Signed)
Pt. Is changing his infusion set and cartridge q 3 days, and giving the 0.3 fixed prime to fill the canula.   Pump download shows some low blood sugars, that he says are related to activity at work.  Discussed the need to reduce the insulin amount for the bolus at lunch if he knows he will be acitive.  He reported good understanding of this.   No changes made to pump settings.

## 2014-11-09 ENCOUNTER — Encounter: Payer: Self-pay | Admitting: Internal Medicine

## 2015-03-15 ENCOUNTER — Ambulatory Visit: Payer: 59 | Attending: Internal Medicine

## 2015-05-20 ENCOUNTER — Ambulatory Visit (INDEPENDENT_AMBULATORY_CARE_PROVIDER_SITE_OTHER): Payer: 59 | Admitting: Family Medicine

## 2015-05-20 ENCOUNTER — Ambulatory Visit (INDEPENDENT_AMBULATORY_CARE_PROVIDER_SITE_OTHER): Payer: 59

## 2015-05-20 ENCOUNTER — Other Ambulatory Visit: Payer: Self-pay | Admitting: Family Medicine

## 2015-05-20 VITALS — BP 122/80 | HR 62 | Temp 98.0°F | Resp 16 | Ht 66.0 in | Wt 132.0 lb

## 2015-05-20 DIAGNOSIS — R05 Cough: Secondary | ICD-10-CM

## 2015-05-20 DIAGNOSIS — M79641 Pain in right hand: Secondary | ICD-10-CM

## 2015-05-20 DIAGNOSIS — K0889 Other specified disorders of teeth and supporting structures: Secondary | ICD-10-CM | POA: Diagnosis not present

## 2015-05-20 DIAGNOSIS — R059 Cough, unspecified: Secondary | ICD-10-CM

## 2015-05-20 MED ORDER — MELOXICAM 15 MG PO TABS: 15.0000 mg | ORAL_TABLET | Freq: Every day | ORAL | Status: DC

## 2015-05-20 NOTE — Patient Instructions (Signed)
We will refer you to the dental clinic to look at your teeth. Good dental care is especially important for patients with diabetes Your lungs look good- it sounds like you likely have allergies I would suggest that you try the over the counter allergy medication loratadine- this may well help with your cough.  Let me know if it does not help Your hand x-ray looks ok.  Try taking the mobic once a day for 2 weeks.  If the pain comes back after using this medication please give me a call

## 2015-05-20 NOTE — Progress Notes (Signed)
[facility] [address] [place] [address] [phone]  Date:  05/20/2015   Name:  Barry Potter   DOB:  08-17-1985   MRN:  [id number]  PCP:  Annabell Sabal, MD    Chief Complaint: Referral and Hand Pain   History of Present Illness:  Barry Potter is a 29 y.o. very pleasant male patient who presents with the following:  He is here today seeking advice about pain in his right long finger for about 8 months- NKI The pain gets better when he rests, but is worse when he works for a few days at a time He also feels that the inside of his chest is" itching"- it is not painful He may have some cough. This has been going on for about 18 months- started when he first moved to [place]  His diabetes is managed by an endocrinologist.  He does not use a pump- he does shots.  He is a pt of Dr. Cruzita Lederer  It does not look like he has followed up for his DM in some months  Patient Active Problem List   Diagnosis Date Noted  . Diabetes type 1, uncontrolled ([place]) 12/04/2013  . Diabetic retinopathy ([place]) 12/04/2013  . Refugee health examination 12/04/2013    Past Medical History  Diagnosis Date  . Diabetes mellitus without complication [facility])     Past Surgical History  Procedure Laterality Date  . Tonsillectomy  2001    Social History  Substance Use Topics  . Smoking status: Never Smoker   . Smokeless tobacco: Never Used  . Alcohol Use: No    Family History  Problem Relation Age of Onset  . Diabetes Sister     No Known Allergies  Medication list has been reviewed and updated.  Current Outpatient Prescriptions on File Prior to Visit  Medication Sig Dispense Refill  . ACCU-CHEK SOFTCLIX LANCETS lancets Use to test blood sugar 6 times daily as instructed. Dx code: 250.03 200 each 11  . Blood Glucose Monitoring Suppl (ACCU-CHEK AVIVA PLUS) W/DEVICE KIT Use to test blood sugars daily as instructed. Dx code: 250.03 1 kit 0  . glucose blood (ACCU-CHEK AVIVA) test strip  Use to test blood sugar 4 times daily as instructed. Dx code: E10.65 200 each 11  . glucose blood (BAYER CONTOUR NEXT TEST) test strip Use to test blood sugar 7 times daily as instructed. Dx: E10.65 225 each 11  . Insulin Human (INSULIN PUMP) SOLN Inject into the skin.    Marland Kitchen insulin lispro (HUMALOG) 100 UNIT/ML injection Use up to 50 units daily via insulin pump as directed. Dx: E10.65 20 mL 2  . glucagon (GLUCAGON EMERGENCY) 1 MG injection Inject 1 mg into the muscle once as needed. (Patient not taking: Reported on 05/20/2015) 1 each 12  . Insulin Detemir (LEVEMIR FLEXTOUCH) 100 UNIT/ML Pen Inject 22 units into the skin at bedtime. (Patient not taking: Reported on 05/20/2015) 15 mL 11  . Insulin Pen Needle (CAREFINE PEN NEEDLES) 32G X 4 MM MISC Use 4x a day (Patient not taking: Reported on 10/09/2014) 200 each 3  . LANTUS SOLOSTAR 100 UNIT/ML Solostar Pen INJECT 22 UNITS INTO THE SKIN AT BEDTIME (Patient not taking: Reported on 10/09/2014) 15 mL 0  . VITAMIN D, ERGOCALCIFEROL, PO Take 2 tablets by mouth daily.     No current facility-administered medications on file prior to visit.    Review of Systems:  As per HPI- otherwise negative.   Physical Examination: Filed Vitals:  05/20/15 1000  BP: 122/80  Pulse: 62  Temp: 98 F (36.7 C)  Resp: 16   Filed Vitals:   05/20/15 1000  Height: _0  (1.676 m)  Weight: 132 lb (59.875 kg)   Body mass index is 21.32 kg/(m^2). Ideal Body Weight: Weight in (lb) to have BMI = 25: 154.6  GEN: WDWN, NAD, Non-toxic, A & O x 3, looks well HEENT: Atraumatic, Normocephalic. Neck supple. No masses, No LAD. Ears and Nose: No external deformity. CV: RRR, No M/G/R. No JVD. No thrill. No extra heart sounds. PULM: CTA B, no wheezes, crackles, rhonchi. No retractions. No resp. distress. No accessory muscle use. EXTR: No c/c/e NEURO Normal gait.  PSYCH: Normally interactive. Conversant. Not depressed or anxious appearing.  Calm demeanor.  Right hand: he  indicates the PIP joint of the right long finger as the area of most concern.  It is a little stiff on flexion, full extension.  Minimal thickening of this joint, no redness or heat The rest of his hands are normal Reduced grip strength on right due to pain in his finger  [facility] reading (PRIMARY) by  Dr. Lorelei Pont: UOR:[id number] Right hand: negative- no explanation for long finger pain noted   CHEST 2 VIEW  COMPARISON: None.  FINDINGS: The heart size and mediastinal contours are within normal limits. Both lungs are clear. The visualized skeletal structures are unremarkable.  IMPRESSION: No active cardiopulmonary disease.  RIGHT HAND - COMPLETE 3+ VIEW  COMPARISON: None.  FINDINGS: There is no evidence of fracture or dislocation. There is no evidence of arthropathy or other focal bone abnormality. No bony lesions, destruction or erosions identified. Soft tissues are unremarkable.  IMPRESSION: Normal right hand.  Assessment and Plan: Right hand pain - Plan: DG Hand Complete Right, meloxicam (MOBIC) 15 MG tablet  Tooth pain - Plan: Ambulatory referral to Dentistry  Cough - Plan: DG Chest 2 View  Declines flu shot today Treat for finger pain with mobic, likely allergies with loratadine Referral to dental clinic that will see him with his "orange card" per his request Asked him to let me know if not feeling better soon  Signed Lamar Blinks, MD

## 2015-06-06 ENCOUNTER — Other Ambulatory Visit: Payer: Self-pay | Admitting: Internal Medicine

## 2015-06-27 ENCOUNTER — Other Ambulatory Visit: Payer: Self-pay | Admitting: Internal Medicine

## 2015-07-19 ENCOUNTER — Other Ambulatory Visit: Payer: Self-pay | Admitting: *Deleted

## 2015-07-19 MED ORDER — INSULIN LISPRO 100 UNIT/ML ~~LOC~~ SOLN: SUBCUTANEOUS | Status: DC

## 2015-07-25 ENCOUNTER — Other Ambulatory Visit: Payer: Self-pay | Admitting: *Deleted

## 2015-07-25 MED ORDER — INSULIN ASPART 100 UNIT/ML ~~LOC~~ SOLN: SUBCUTANEOUS | Status: DC

## 2015-08-02 ENCOUNTER — Encounter: Payer: Self-pay | Admitting: Internal Medicine

## 2015-08-02 ENCOUNTER — Ambulatory Visit (INDEPENDENT_AMBULATORY_CARE_PROVIDER_SITE_OTHER): Payer: BLUE CROSS/BLUE SHIELD | Admitting: Internal Medicine

## 2015-08-02 ENCOUNTER — Other Ambulatory Visit (INDEPENDENT_AMBULATORY_CARE_PROVIDER_SITE_OTHER): Payer: 59 | Admitting: *Deleted

## 2015-08-02 VITALS — BP 120/76 | HR 78 | Temp 98.2°F | Resp 12 | Wt 130.6 lb

## 2015-08-02 DIAGNOSIS — IMO0001 Reserved for inherently not codable concepts without codable children: Secondary | ICD-10-CM | POA: Insufficient documentation

## 2015-08-02 DIAGNOSIS — E1065 Type 1 diabetes mellitus with hyperglycemia: Principal | ICD-10-CM

## 2015-08-02 DIAGNOSIS — E109 Type 1 diabetes mellitus without complications: Secondary | ICD-10-CM

## 2015-08-02 LAB — POCT GLYCOSYLATED HEMOGLOBIN (HGB A1C): Hemoglobin A1C: 6.7

## 2015-08-02 MED ORDER — INSULIN ASPART 100 UNIT/ML ~~LOC~~ SOLN: SUBCUTANEOUS | Status: DC

## 2015-08-02 MED ORDER — GLUCOSE BLOOD VI STRP: ORAL_STRIP | Status: DC

## 2015-08-02 NOTE — Patient Instructions (Addendum)
Please stop at the lab.  Please change the pump settings as follows: - basal rates: 12 am: 0.9 units/h 12 pm: 0.80 6 pm: 0.9 >> 1.0 - ICR:  12 am: 12 11 am: 15 7 pm: 12 >> 10 - target: 110-110 - ISF: 70  - Insulin on Board: 4h - bolus wizard: on  Please also bolus with snacks.  Please start a bolus 15 min before a meal.  Please return in 3 months.  Please schedule an appt with Cristy FolksLinda Spagnola for diabetes education in 1-1.5 months.

## 2015-08-02 NOTE — Progress Notes (Signed)
Patient ID: Barry Potter, male   DOB: 1985-12-06, 30 y.o.   MRN: [id number]  HPI: Barry Potter is a 30 y.o.-year-old male, initially referred by his PCP, Dr. Bridgett Larsson, for management of DM1, dx 2002, uncontrolled, with complications (mild DR). He returns for f/u. Last visit 1 year ago! He is here with the Arabic interpreter, who translates for [place]. Insurance: [employer] >> now [employer].  He started on an insulin pump now: Medtronic Minimed 522. No CGM.  Last hemoglobin A1c was: Lab Results  Component Value Date   HGBA1C 6.6* 05/15/2014   HGBA1C 7.0 12/04/2013   Pt was on: Lantus 25 units in hs  NovoLog: - 8 units with large meal  - 6 units with regular meal  - 4-5 units with small meal  Usually: B'fast: 4-5 units Lunch: 6-8 units Dinner: 4  He is now on: Pump settings: - basal rates: 12 am: 0.9 units/h 12 pm: 0.80 6 pm: 0.9 TDD from basal insulin: 21 units (47%) - ICR: 12, exccept 11 am- 7 pm: 15 - target: 110-110 - ISF: 70  - Insulin on Board: 4h - bolus wizard: on TDD from bolus insulin: 24 units (53%) - extended bolusing: not using - changes infusion site: q3 days - Meter: Bayer  He is working as a Dealer.   Pt checks his sugars 5x a day:  - am: 60-350 >> 48-135 >> 41-177 >> 68-185 >> 50-110, 152, 185 >> 52-122, 198 >> 62, 76-216, 348 - 2h after b'fast: n/c >> 60-200 >> n/c >> 61-271 >> 56-150, 170 >> 130-174 >> 105-180 - before lunch: 61-214 >> 63-192 >> 93-125, 155 >> 61-197 >> 66 (when delays lunch), 78-188, 205 - 2h after lunch: n/c >> 170-200 >> 53-159 (277x1) >> 73-165, 220 >> 102-185, 190 >> 94-217 >> 100-235, 361 - before dinner: n/c >> 71-175 >> 75, 147 >> 139-242 >> 60-109, 172, 187 >> 91-241, 290 >> 92-328 - 2h after dinner: [place] >> 110-200 >> 64-208 >> 768-240, 299 >> 51, 55, 103-167 >> 67-293 >> 76, 109, 169-351 - bedtime: 130-150 >> 120-190 >> 86-125 >> 131-203, 294 >> n/c >> 46, 59-170 >> see above - nighttime: 60-120 >> n/c >> 49, 270 >> n/c >>  107-126, had 1x222 >> corrected  >> 2h later: 35 >> 173-346 Lowest sugar was 60s; he has hypoglycemia awareness at 60.  No previous hypoglycemia admission. Does have a glucagon kit at home. Highest sugar was 300's. No previous DKA admissions. He was admitted in 2005 for high sugars ? DKA.  Pt's meals are: - am: egg, tea, bread - largest meal of the day - lunch: rice, meat, salad - dinner: yoghurt, salad, rice - smallest meal of the day - Snacks: 3x or more  He works 6 am-6 pm.  - no CKD, last BUN/creatinine:  Lab Results  Component Value Date   BUN 14 12/04/2013   CREATININE 0.84 12/04/2013  - last eye exam was in 11/2013. + R DR - mild (reportedly) - no numbness and tingling in his feet.  I reviewed pt's medications, allergies, PMH, social hx, family hx, and changes were documented in the history of present illness. Otherwise, unchanged from my initial visit note.  ROS: Constitutional: no weight gain/loss, no fatigue, no subjective hyperthermia/hypothermia Eyes: no blurry vision, no xerophthalmia ENT: no sore throat, no nodules palpated in throat, no dysphagia/odynophagia, no hoarseness Cardiovascular: no CP/SOB/palpitations/leg swelling Respiratory: no cough/SOB Gastrointestinal: no N/V/D/C Musculoskeletal: no muscle/joint aches Skin: no rashes Neurological: no tremors/numbness/tingling/dizziness  PE: BP 120/76 mmHg  Pulse 78  Temp(Src) 98.2 F (36.8 C) (Oral)  Resp 12  Wt 130 lb 9.6 oz (59.24 kg)  SpO2 95% Body mass index is 21.09 kg/(m^2). Wt Readings from Last 3 Encounters:  08/02/15 130 lb 9.6 oz (59.24 kg)  05/20/15 132 lb (59.875 kg)  10/09/14 126 lb (57.153 kg)   Constitutional: thin, in NAD Eyes: PERRLA, EOMI, no exophthalmos ENT: moist mucous membranes, no thyromegaly, no cervical lymphadenopathy Cardiovascular: RRR, No MRG Respiratory: CTA B Gastrointestinal: abdomen soft, NT, ND, BS+ Musculoskeletal: no deformities, strength intact in all 4 Skin:  moist, warm, no rashes Neurological: no tremor with outstretched hands, DTR normal in all 4  ASSESSMENT: 1. DM1, uncontrolled, without complications   PLAN:  1. Patient with long standing DM1, on an insulin pump now, started after our last visit, 1 year ago. He was lost for f/u afterwards. Overall, he is doing a good job checking his sugars frequently, 5 times a day, and bolusing with his meals and occasionally for correction between meals. He relies on the bolus wizard to direct the boluses. He is also doing a great job in changing his infusion site every 3 days. - He now starts the boluses at the time of the meal, and not 15 minutes before, and I advised him to start bolusing before the meal. -Sugars are higher after 5 PM and the stay high until morning. He has a period of time between 10 and 4 PM when the sugars are better controlled. I will decrease his ICR with dinner to improve his sugars after this meal and we'll also increase his basal rate in the evening to help with the same problem. - He did not know how to introduce his setting changes Into the pump, so I did that for him, in [place]. He then switched back the pump instruction language to Arabic. - I advised him to: Patient Instructions  Please stop at the lab.  Please change the pump settings as follows: - basal rates: 12 am: 0.9 units/h 12 pm: 0.80 6 pm: 0.9 >> 1.0 - ICR:  12 am: 12 11 am: 15 7 pm: 12 >> 10 - target: 110-110 - ISF: 70  - Insulin on Board: 4h - bolus wizard: on  Please also bolus with snacks.  Please start a bolus 15 min before a meal.  Please return in 3 months.  Please schedule an appt with Leonia Reader for diabetes education in 1-1.5 months.  - continue checking sugars at different times of the day - check >4 times a day, rotating checks - checked HbA1c today >> 6.7% (great!) - advised him to see the DM educator in another month - Refilled his test strips and given him a coupon - needs a  new eye exam - We'll check annual type 1 diabetes labs today: Orders Placed This Encounter  Procedures  . COMPLETE METABOLIC PANEL WITH GFR  . Microalbumin / creatinine urine ratio  . Lipid panel  . TSH  - Return to see me in 3 mo   - time spent with the patient: 40 min, of which >50% was spent in reviewing his  insulin pump downloads, discussing his hypo- and hyper-glycemic episodes, reviewing  previous labs and insulin doses,developing a plan to avoid hypo- and hyper-glycemia.  Addendum: Patient did not stop at the lab, we'll need to check his labs at next visit.

## 2015-08-12 ENCOUNTER — Telehealth: Payer: Self-pay | Admitting: Internal Medicine

## 2015-08-12 NOTE — Telephone Encounter (Signed)
Patient Name: Barry Potter Gender: Male DOB: 10-10-1985 Age: 30 Y 7 D Return Phone Number: [phone] (Primary), [phone] (Secondary) Address: City/State/Zip: [place] Client [facility] Night - Client Client Site [facility] Physician Carlus Pavlov Contact Type Call Call Type Triage / Clinical Caller Name Darlis Loan Relationship To Patient Other relative Return Phone Number [phone] (Secondary) Chief Complaint Paging or Request for Consult Initial Comment Courtesy call - wants nurse to call back as soon as possible - Caller states his cousin needs to speak to the on call because she is trying to order insulin for an insulin pump and she needs exact measurement. Could the on call be paged - the pt is out of insulin. Nurse Assessment Nurse: Roxana Hires, RN, [place] Date/Time (Eastern Time): 08/10/2015 5:16:36 PM Confirm and document reason for call. If symptomatic, describe symptoms. You must click the next button to save text entered. ---caller states that pharmacy needs exact measurement. He has mini med vial. Has new insurance [employer] and they do not know what size syringe she needs for the pump Has the patient traveled out of the country within the last 30 days? ---Not Applicable Does the patient have any new or worsening symptoms? ---No Please document clinical information provided and list any resource used. ---provided customer service number for medtronic Guidelines Guideline Title Affirmed Question Affirmed Notes Nurse Date/Time (Eastern Time) Disp. Time Lamount Cohen Time) Disposition Final User

## 2015-08-12 NOTE — Telephone Encounter (Signed)
Insulin refill was sent on 08/02/15 to pt's pharmacy.

## 2015-09-18 ENCOUNTER — Ambulatory Visit: Payer: 59 | Admitting: Internal Medicine

## 2015-09-27 ENCOUNTER — Ambulatory Visit: Payer: BLUE CROSS/BLUE SHIELD | Admitting: Internal Medicine

## 2015-10-02 ENCOUNTER — Ambulatory Visit: Payer: BLUE CROSS/BLUE SHIELD | Admitting: Internal Medicine

## 2015-10-03 ENCOUNTER — Ambulatory Visit (INDEPENDENT_AMBULATORY_CARE_PROVIDER_SITE_OTHER): Payer: BLUE CROSS/BLUE SHIELD | Admitting: Internal Medicine

## 2015-10-03 ENCOUNTER — Other Ambulatory Visit: Payer: Self-pay | Admitting: *Deleted

## 2015-10-03 ENCOUNTER — Other Ambulatory Visit: Payer: Self-pay | Admitting: Internal Medicine

## 2015-10-03 ENCOUNTER — Encounter: Payer: Self-pay | Admitting: Internal Medicine

## 2015-10-03 VITALS — BP 118/80 | HR 80 | Temp 98.4°F | Resp 12 | Wt 134.6 lb

## 2015-10-03 DIAGNOSIS — E109 Type 1 diabetes mellitus without complications: Secondary | ICD-10-CM

## 2015-10-03 DIAGNOSIS — IMO0001 Reserved for inherently not codable concepts without codable children: Secondary | ICD-10-CM

## 2015-10-03 DIAGNOSIS — E1065 Type 1 diabetes mellitus with hyperglycemia: Principal | ICD-10-CM

## 2015-10-03 MED ORDER — BAYER MICROLET LANCETS MISC: Status: DC

## 2015-10-03 NOTE — Patient Instructions (Signed)
Please stop at the lab.  Please change the pump settings as follows: - basal rates: 12 am: 0.9 units/h 12 pm: 0.80  6 pm: 1.0 - ICR:  12 am: 12 11 am: 15 >> 13 7 pm: 10 >> 12 - target: 110-110 - ISF: 70  - Insulin on Board: 4h - bolus wizard: on  Please return in 3 months.

## 2015-10-03 NOTE — Progress Notes (Signed)
Patient ID: Barry Potter, male   DOB: Jan 07, 1986, 30 y.o.   MRN: [id number]  HPI: Barry Potter is a 30 y.o.-year-old male, initially referred by his PCP, Dr. Bridgett Potter, for management of DM1, dx 2002, uncontrolled, with complications (mild DR). He returns for f/u. Last visit 2 mo ago.  He is here with the Arabic interpreter, who translates for [place]. Insurance: [employer] >> now [employer].  He is on an insulin pump now: Medtronic Minimed 522. No CGM.  Last hemoglobin A1c was: Lab Results  Component Value Date   HGBA1C 6.7 08/02/2015   HGBA1C 6.6* 05/15/2014   HGBA1C 7.0 12/04/2013   Pt was on: Lantus 25 units in hs  NovoLog: - 8 units with large meal  - 6 units with regular meal  - 4-5 units with small meal  Usually: B'fast: 4-5 units Lunch: 6-8 units Dinner: 4  He is now on: - basal rates: 12 am: 0.9 units/h 12 pm: 0.80 6 pm: 0.9 >> 1.0 - ICR:  12 am: 12 11 am: 15 7 pm: 12 >> 10 - target: 110-110 - ISF: 70  - Insulin on Board: 4h - bolus wizard: on TDD from basal insulin: 21.6 units (47%) TDD from bolus insulin: 24.5 units (53%) - extended bolusing: not using - changes infusion site: q3 days - Meter: Bayer  He is working as a Dealer.   Pt checks his sugars 6.3x a day:  Prev:  - am: 60-350 >> 48-135 >> 41-177 >> 68-185 >> 50-110, 152, 185 >> 52-122, 198 >> 62, 76-216, 348 >> 87-148, 198, 329 - 2h after b'fast: n/c >> 60-200 >> n/c >> 61-271 >> 56-150, 170 >> 130-174 >> 105-180 >> n/c - before lunch: 61-214 >> 63-192 >> 93-125, 155 >> 61-197 >> 66 (when delays lunch), 78-188, 205 >> 83-179 - 2h after lunch:53-159 (277x1) >> 73-165, 220 >> 102-185, 190 >> 94-217 >> 100-235, 361 >> 119-210 - before dinner: n/c >> 71-175 >> 75, 147 >> 139-242 >> 60-109, 172, 187 >> 91-241, 290 >> 92-328 >> 72-235 - 2h after dinner: 110-200 >> 64-208 >> 768-240, 299 >> 51, 55, 103-167 >> 67-293 >> 76, 109, 169-351 >> 72-125, 218 - bedtime: 130-150 >> 120-190 >> 86-125 >> 131-203, 294 >>  n/c >> 46, 59-170 >> see above - nighttime: 49, 270 >> n/c >> 107-126, had 1x222 >> corrected  >> 2h later: 35 >> 173-346 >> 70, 99 174 Lowest sugar was 60; he has hypoglycemia awareness at 60.  No previous hypoglycemia admission. Does have a glucagon kit at home. Highest sugar was 300's. No previous DKA admissions. He was admitted in 2005 for high sugars ? DKA.  Pt's meals are: - am: egg, tea, bread - largest meal of the day - lunch: rice, meat, salad - dinner: yoghurt, salad, rice - smallest meal of the day - Snacks: 3x or more  He works 6 am-6 pm.  - no CKD, last BUN/creatinine:  Lab Results  Component Value Date   BUN 14 12/04/2013   CREATININE 0.84 12/04/2013  - last eye exam was in 11/2013. + R DR - mild (reportedly) - no numbness and tingling in his feet.  I reviewed pt's medications, allergies, PMH, social hx, family hx, and changes were documented in the history of present illness. Otherwise, unchanged from my initial visit note.  ROS: Constitutional: no weight gain/loss, no fatigue, no subjective hyperthermia/hypothermia Eyes: no blurry vision, no xerophthalmia ENT: no sore throat, no nodules palpated in throat, no dysphagia/odynophagia, no  hoarseness Cardiovascular: no CP/SOB/palpitations/leg swelling Respiratory: no cough/SOB Gastrointestinal: no N/V/D/C Musculoskeletal: no muscle/joint aches Skin: no rashes Neurological: no tremors/numbness/tingling/dizziness  PE: BP 118/80 mmHg  Pulse 80  Temp(Src) 98.4 F (36.9 C) (Oral)  Resp 12  Wt 134 lb 9.6 oz (61.054 kg)  SpO2 99% Body mass index is 21.74 kg/(m^2). Wt Readings from Last 3 Encounters:  10/03/15 134 lb 9.6 oz (61.054 kg)  08/02/15 130 lb 9.6 oz (59.24 kg)  05/20/15 132 lb (59.875 kg)   Constitutional: thin, in NAD Eyes: PERRLA, EOMI, no exophthalmos ENT: moist mucous membranes, no thyromegaly, no cervical lymphadenopathy Cardiovascular: RRR, No MRG Respiratory: CTA B Gastrointestinal: abdomen  soft, NT, ND, BS+ Musculoskeletal: no deformities, strength intact in all 4 Skin: moist, warm, no rashes Neurological: no tremor with outstretched hands, DTR normal in all 4  ASSESSMENT: 1. DM1, uncontrolled, without complications   PLAN:  1. Patient with long standing DM1, on an insulin pump now, doing a great job with this. He is checking his sugars frequently, >5 times a day, and bolusing with his meals and occasionally for correction between meals. He relies on the bolus wizard to direct the boluses 100% of the time. He is also doing a great job in changing his infusion site every 3 days. His sugars have improved quite significantly since last visit. - His sugars stay close to goal during the day, however, they increase after lunch and decrease after dinner. For this reason, I will decrease his insulin to carb ratio with lunch and increase it with dinner. - I advised him to: Patient Instructions  Please stop at the lab.  Please change the pump settings as follows: - basal rates: 12 am: 0.9 units/h 12 pm: 0.80  6 pm: 1.0 - ICR:  12 am: 12 11 am: 15 >> 13 7 pm: 10 >> 12 - target: 110-110 - ISF: 70  - Insulin on Board: 4h - bolus wizard: on  Please return in 3 months.  - continue checking sugars at different times of the day - check >4 times a day, rotating checks - checked HbA1c today, Along with the rest of his type 1 diabetes annual labs - advised him to see the DM educator in another month - Refilled his test strips and given him a coupon. Also given him coupon for NovoLog. - Last eye exam approximately 8 months ago - Return to see me in 3 mo   - time spent with the patient: 40 min, of which >50% was spent in reviewing his  insulin pump downloads, discussing his hypo- and hyper-glycemic episodes, reviewing  previous labs and insulin doses,developing a plan to avoid hypo- and hyper-glycemia.  He again did not stop for labs!! We'll call him back for these.

## 2015-11-02 ENCOUNTER — Other Ambulatory Visit: Payer: Self-pay | Admitting: Internal Medicine

## 2015-11-04 NOTE — Telephone Encounter (Signed)
done

## 2015-11-04 NOTE — Telephone Encounter (Signed)
Refill is for 50 units, but last Rx was for 60 units and I couldn't tell by last OV note if you have decreased pt's insulin, please advise

## 2015-11-04 NOTE — Telephone Encounter (Signed)
The Rx we have on file is for 60 units a day (had latest refill in 07/2015). Let's re-send that.

## 2016-01-28 ENCOUNTER — Other Ambulatory Visit: Payer: Self-pay | Admitting: Internal Medicine

## 2016-01-29 ENCOUNTER — Other Ambulatory Visit: Payer: Self-pay

## 2016-01-29 MED ORDER — INSULIN ASPART 100 UNIT/ML ~~LOC~~ SOLN: SUBCUTANEOUS | Status: DC

## 2016-02-13 DIAGNOSIS — E119 Type 2 diabetes mellitus without complications: Secondary | ICD-10-CM | POA: Diagnosis not present

## 2016-05-20 ENCOUNTER — Other Ambulatory Visit: Payer: Self-pay | Admitting: Internal Medicine

## 2016-06-23 ENCOUNTER — Other Ambulatory Visit: Payer: Self-pay | Admitting: Internal Medicine

## 2016-07-17 DIAGNOSIS — E119 Type 2 diabetes mellitus without complications: Secondary | ICD-10-CM | POA: Diagnosis not present

## 2016-08-10 DIAGNOSIS — E119 Type 2 diabetes mellitus without complications: Secondary | ICD-10-CM | POA: Diagnosis not present

## 2016-08-15 ENCOUNTER — Other Ambulatory Visit: Payer: Self-pay | Admitting: Internal Medicine

## 2016-08-18 ENCOUNTER — Other Ambulatory Visit: Payer: Self-pay | Admitting: Internal Medicine

## 2016-09-21 ENCOUNTER — Other Ambulatory Visit: Payer: Self-pay | Admitting: Internal Medicine

## 2016-09-23 ENCOUNTER — Ambulatory Visit (INDEPENDENT_AMBULATORY_CARE_PROVIDER_SITE_OTHER): Payer: BLUE CROSS/BLUE SHIELD | Admitting: Internal Medicine

## 2016-09-23 ENCOUNTER — Encounter: Payer: Self-pay | Admitting: Internal Medicine

## 2016-09-23 VITALS — BP 120/82 | HR 85 | Ht 66.0 in | Wt 130.0 lb

## 2016-09-23 DIAGNOSIS — E1065 Type 1 diabetes mellitus with hyperglycemia: Secondary | ICD-10-CM | POA: Diagnosis not present

## 2016-09-23 DIAGNOSIS — IMO0001 Reserved for inherently not codable concepts without codable children: Secondary | ICD-10-CM

## 2016-09-23 LAB — MICROALBUMIN / CREATININE URINE RATIO
CREATININE, U: 161.5 mg/dL
MICROALB/CREAT RATIO: 0.6 mg/g (ref 0.0–30.0)
Microalb, Ur: 1 mg/dL (ref 0.0–1.9)

## 2016-09-23 LAB — COMPLETE METABOLIC PANEL WITH GFR
ALBUMIN: 4.4 g/dL (ref 3.6–5.1)
ALK PHOS: 44 U/L (ref 40–115)
ALT: 17 U/L (ref 9–46)
AST: 18 U/L (ref 10–40)
BUN: 15 mg/dL (ref 7–25)
CO2: 27 mmol/L (ref 20–31)
Calcium: 9.8 mg/dL (ref 8.6–10.3)
Chloride: 101 mmol/L (ref 98–110)
Creat: 0.81 mg/dL (ref 0.60–1.35)
GFR, Est African American: >89 mL/min (ref >=60)
GFR, Est Non African American: >89 mL/min (ref >=60)
GLUCOSE: 227 mg/dL — AB (ref 65–99)
Potassium: 4.5 mmol/L (ref 3.5–5.3)
SODIUM: 138 mmol/L (ref 135–146)
Total Bilirubin: 0.6 mg/dL (ref 0.2–1.2)
Total Protein: 7.3 g/dL (ref 6.1–8.1)

## 2016-09-23 LAB — LIPID PANEL
CHOL/HDL RATIO: 2
Cholesterol: 125 mg/dL (ref 0–200)
HDL: 54 mg/dL (ref >39.00)
LDL Cholesterol: 42 mg/dL (ref 0–99)
NONHDL: 70.56
Triglycerides: 143 mg/dL (ref 0.0–149.0)
VLDL: 28.6 mg/dL (ref 0.0–40.0)

## 2016-09-23 LAB — TSH: TSH: 1.43 u[IU]/mL (ref 0.35–4.50)

## 2016-09-23 LAB — POCT GLYCOSYLATED HEMOGLOBIN (HGB A1C): Hemoglobin A1C: 6

## 2016-09-23 MED ORDER — INSULIN ASPART 100 UNIT/ML ~~LOC~~ SOLN: SUBCUTANEOUS | 3 refills | Status: DC

## 2016-09-23 MED ORDER — GLUCAGON (RDNA) 1 MG IJ KIT: 1.0000 mg | PACK | Freq: Once | INTRAMUSCULAR | 12 refills | Status: DC | PRN

## 2016-09-23 NOTE — Progress Notes (Signed)
Patient ID: Barry Potter, male   DOB: 1985/12/15, 31 y.o.   MRN: [id number]  HPI: Barry Potter is a 31 y.o.-year-old male, initially referred by his PCP, Dr. Bridgett Larsson, for management of DM1, dx 2002, uncontrolled, with complications (mild DR). He returns for f/u. Last visit 1 year ago! He is here with the Arabic interpreter, who translates for [place]. Insurance:BCBS.  He is on an insulin pump now: Medtronic Minimed 522 since 09/2014. No CGM.  Last hemoglobin A1c was: Lab Results  Component Value Date   HGBA1C 6.7 08/02/2015   HGBA1C 6.6 (H) 05/15/2014   HGBA1C 7.0 12/04/2013   Settings: - basal rates: 12 am: 0.9 units/h 12 pm: 0.80  6 pm: 1.0 - ICR:  12 am: 12 11 am: 15 >> 13 - he did not change! 7 pm: 10 >> 12 - he did not change! 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   Pt checks his sugars 30 >> 5.3x a day >> ave 157 +/- 90: - am:52-122, 198 >> 62, 76-216, 348 >> 87-148, 198, 329 >> 55 x1, 67 x1 (corrected a previous high blood sugar), 123-262 - 2h after b'fast: 56-150, 170 >> 130-174 >> 105-180 >> n/c >> 110-224 - before lunch: 61-197 >> 66 (when delays lunch), 78-188, 205 >> 83-179 >> 55-198 - 2h after lunch: 94-217 >> 100-235, 361 >> 119-210 >> 194-381 - before dinner: 60-109, 172, 187 >> 91-241, 290 >> 92-328 >> 72-235 >> 59-244 - 2h after dinner:  67-293 >> 76, 109, 169-351 >> 72-125, 218 >> 60-177 - bedtime: 131-203, 294 >> n/c >> 46, 59-170 >> see above - nighttime: 35 >> 173-346 >> 70, 99 174 >> 377 Lowest sugar was 60 >> 54 (aftre correction of high CBGs usually); he has hypoglycemia awareness at 60.  No previous hypoglycemia admission. Does have a glucagon kit at home >> expired/. Highest sugar was 300's >>  >400. He was admitted in 2005 for high sugars ? DKA.  Pt's meals are: - am: egg, tea, bread - largest meal of the day - lunch: rice, meat, salad - dinner: yoghurt, salad, rice - smallest meal of the day - Snacks: 3x or more  He works 6 am-6 pm.  - no CKD, last BUN/creatinine:  Lab Results  Component Value Date   BUN 14 12/04/2013   CREATININE 0.84 12/04/2013  - last eye exam was in 07/2016. Prev. + R DR - mild >> now no DR reportedly - no numbness and tingling in his feet.  I reviewed pt's medications, allergies, PMH, social hx, family hx, and changes were documented in the history of present illness. Otherwise, unchanged from my initial visit note.  ROS: Constitutional: no weight gain/loss, no fatigue, no subjective hyperthermia/hypothermia Eyes: no blurry vision, no xerophthalmia ENT: no sore throat, no nodules palpated in throat, no dysphagia/odynophagia, no hoarseness Cardiovascular: no CP/SOB/palpitations/leg swelling Respiratory: no cough/SOB Gastrointestinal: no N/V/D/C Musculoskeletal: no muscle/joint aches Skin: no rashes Neurological: no tremors/numbness/tingling/dizziness  PE: BP 120/82 (BP Location: Left Arm, Patient Position: Sitting)   Pulse 85   Ht _0  (1.676 m)   Wt 130 lb (59 kg)   SpO2 98%   BMI 20.98 kg/m  Body  mass index is 20.98 kg/m. Wt Readings from Last 3 Encounters:  09/23/16 130 lb (59 kg)  10/03/15 134 lb 9.6 oz (61.1 kg)  08/02/15 130 lb 9.6 oz (59.2 kg)   Constitutional: thin, in NAD Eyes: PERRLA, EOMI, no exophthalmos ENT: moist mucous membranes, no thyromegaly, no cervical lymphadenopathy Cardiovascular: RRR, No MRG Respiratory: CTA B Gastrointestinal: abdomen soft, NT, ND, BS+ Musculoskeletal: no deformities, strength intact in all 4 Skin: moist, warm, no rashes Neurological: no tremor with outstretched hands, DTR normal in all 4  ASSESSMENT: 1. DM1, uncontrolled, without long term complications, but with  hypoglycemia   PLAN:  1. Patient with long standing DM1, on an insulin pump, with more low blood sugars at this visit compared to before. His HbA1c checked today is also lower, at 6.0%. - He is doing a good job With checking his sugars frequently, bolusing with his meals, and changing his infusion site every 3 days.  Barriers to good control: - He is very busy during the day and may delay lunch. Subsequently, he may have lows midday. I will decrease his basal rates around this time. - when he has to correct a high blood sugar, he feels that the pump is not giving him enough insulin so he doesn't drop his sugars fast enough. Therefore, he usually introduces an amount of carbohydrates in the pump to be allowed to give himself more insulin. Subsequently, he drops precipitously during correction. We discussed that this is not ideal, and I will decrease his insulin sensitivity factor now to allow him bolus more for correction  - If blood sugars could be high in a.m. so I will increase his basal rates from 4 to 8 AM - Since his sugars go up after he eats lunch, I will decrease his ICR with this meal - As his dropping his sugars after dinner, will increase his ICR with this meal. - Will send a new glucagon kit to his pharmacy - I advised him to: Patient Instructions  Please change: - basal rates: 12 am: 0.9 units/h 4-8 am: 0.9 >> 1.0 12 pm: 0.8 >> 0.7 6 pm: 1.0 >> 0.9 - ICR:  12 am: 12 11 am: 15 >> 13  7 pm: 10 >> 11 - target: 110-110 - ISF:  12 am - 10 am: 70 10 am - 12 am: 70 >> 55 - Insulin on Board: 4h - bolus wizard: on  - continue checking sugars at different times of the day - check >4 times a day, rotating checks - will check the rest of his type 1 diabetes annual labs - Refilled his NovoLog. - Last eye exam 1 months ago - Return to see me in 3 mo   - time spent with the patient: 40 min, of which >50% was spent in reviewing his pump downloads, discussing his hypo- and  hyper-glycemic episodes, reviewing previous labs and pump settings and developing a plan to avoid hypo- and hyper-glycemia.   Office Visit on 09/23/2016  Component Date Value Ref Range Status  . Sodium 09/23/2016 138  135 - 146 mmol/L Final  . Potassium 09/23/2016 4.5  3.5 - 5.3 mmol/L Final  . Chloride 09/23/2016 101  98 - 110 mmol/L Final  . CO2 09/23/2016 27  20 - 31 mmol/L Final  . Glucose, Bld 09/23/2016 227* 65 - 99 mg/dL Final  . BUN 09/23/2016 15  7 - 25 mg/dL Final  . Creat 09/23/2016 0.81  0.60 - 1.35 mg/dL Final  . Total Bilirubin 09/23/2016  0.6  0.2 - 1.2 mg/dL Final  . Alkaline Phosphatase 09/23/2016 44  40 - 115 U/L Final  . AST 09/23/2016 18  10 - 40 U/L Final  . ALT 09/23/2016 17  9 - 46 U/L Final  . Total Protein 09/23/2016 7.3  6.1 - 8.1 g/dL Final  . Albumin 09/23/2016 4.4  3.6 - 5.1 g/dL Final  . Calcium 09/23/2016 9.8  8.6 - 10.3 mg/dL Final  . GFR, Est African American 09/23/2016 >89  >=60 mL/min Final  . GFR, Est Non African American 09/23/2016 >89  >=60 mL/min Final  . Microalb, Ur 09/23/2016 1.0  0.0 - 1.9 mg/dL Final  . Creatinine,U 09/23/2016 161.5  mg/dL Final  . Microalb Creat Ratio 09/23/2016 0.6  0.0 - 30.0 mg/g Final  . Cholesterol 09/23/2016 125  0 - 200 mg/dL Final  . Triglycerides 09/23/2016 143.0  0.0 - 149.0 mg/dL Final  . HDL 09/23/2016 54.00  >39.00 mg/dL Final  . VLDL 09/23/2016 28.6  0.0 - 40.0 mg/dL Final  . LDL Cholesterol 09/23/2016 42  0 - 99 mg/dL Final  . Total CHOL/HDL Ratio 09/23/2016 2   Final  . NonHDL 09/23/2016 70.56   Final  . TSH 09/23/2016 1.43  0.35 - 4.50 uIU/mL Final  . Hemoglobin A1C 09/23/2016 6.0   Final   Labs are normal, except high Glu. Philemon Kingdom, MD PhD [facility]

## 2016-09-23 NOTE — Patient Instructions (Signed)
Please change: - basal rates: 12 am: 0.9 units/h 4-8 am: 0.9 >> 1.0 12 pm: 0.8 >> 0.7 6 pm: 1.0 >> 0.9 - ICR:  12 am: 12 11 am: 15 >> 13  7 pm: 10 >> 11 - target: 110-110 - ISF:  12 am - 10 am: 70 10 am - 12 am: 70 >> 55 - Insulin on Board: 4h - bolus wizard: on

## 2016-09-24 ENCOUNTER — Telehealth: Payer: Self-pay

## 2016-09-24 DIAGNOSIS — E119 Type 2 diabetes mellitus without complications: Secondary | ICD-10-CM | POA: Diagnosis not present

## 2016-09-24 NOTE — Telephone Encounter (Signed)
Called patient and gave lab results. Patient had no questions or concerns.  

## 2016-09-24 NOTE — Telephone Encounter (Signed)
-----   Message from Barry Pavlovristina Gherghe, MD sent at 09/24/2016  7:30 AM EST ----- Raynelle FanningJulie, can you please call pt:  Labs are normal, except high Glu.

## 2016-10-19 ENCOUNTER — Telehealth: Payer: Self-pay | Admitting: Internal Medicine

## 2016-10-19 ENCOUNTER — Encounter: Payer: BLUE CROSS/BLUE SHIELD | Attending: Internal Medicine | Admitting: Nutrition

## 2016-10-19 DIAGNOSIS — E1065 Type 1 diabetes mellitus with hyperglycemia: Secondary | ICD-10-CM

## 2016-10-19 DIAGNOSIS — Z029 Encounter for administrative examinations, unspecified: Secondary | ICD-10-CM | POA: Diagnosis not present

## 2016-10-19 DIAGNOSIS — IMO0001 Reserved for inherently not codable concepts without codable children: Secondary | ICD-10-CM

## 2016-10-19 NOTE — Telephone Encounter (Signed)
3/30 250 3/31 350 4/1 400   Pt is adding more insulin to compensate and it drops to 150 each time and then he eats and it shoots back up again.

## 2016-10-19 NOTE — Telephone Encounter (Signed)
Pt. Came into the office wanting to be seen.  See office note

## 2016-10-19 NOTE — Telephone Encounter (Signed)
Sister calling to let us know that his blood sugars have been high, this is resulting in him not eating so he is very weak. Sister will be calling back to give [place] the blood sugar readings

## 2016-10-19 NOTE — Telephone Encounter (Signed)
Please advise. Thank you

## 2016-10-19 NOTE — Telephone Encounter (Signed)
Julie, Raynelle Fanningn he come today or tomorrow to see Bonita Quin?

## 2016-10-20 NOTE — Progress Notes (Signed)
Mr. Violett came into the office without an appt. Wanting to be seen.  Said blood sugars are in the 200s "all of the time" in the last 2 weeks, with no changes to his diet. Pump download shows he did not make any changes to his settings from his last visit with Dr. Elvera Lennox.   He bolusing what the pump tells him to give, and not adjusting any boluses, except when extremely high-he will take 1-2u extra, and still it remains high.   Setting were changed to what Dr. Elvera Lennox wanted from last visit: Basal rate:MN: 0.9, 4AM: 1.0, 12PM: 0.7, 6PM: 0.9    I/C ratio changes MN: 12 (same), 11AM from 15 to 13, and 6PM: 10 to 11.  ISF changed from 70 to 55.   He was told to test ac and 2hr. pc today and tomorrow morning, and call me after breafkast.  He agreed to do this.

## 2016-10-20 NOTE — Patient Instructions (Signed)
Test blood sugars today before lunch, 2hr. After lunch, before supper, 2hr. After supper, at bedtime, before breakfast tomorrow, and 2hr. After breakfast tomorrow, and call me the results.

## 2016-10-21 ENCOUNTER — Other Ambulatory Visit: Payer: Self-pay | Admitting: Internal Medicine

## 2016-12-24 DIAGNOSIS — E119 Type 2 diabetes mellitus without complications: Secondary | ICD-10-CM | POA: Diagnosis not present

## 2016-12-25 ENCOUNTER — Telehealth: Payer: Self-pay | Admitting: Internal Medicine

## 2016-12-25 ENCOUNTER — Ambulatory Visit: Payer: BLUE CROSS/BLUE SHIELD | Admitting: Internal Medicine

## 2016-12-25 DIAGNOSIS — Z0289 Encounter for other administrative examinations: Secondary | ICD-10-CM

## 2016-12-25 NOTE — Telephone Encounter (Signed)
3-4 mo 

## 2016-12-25 NOTE — Telephone Encounter (Signed)
Patient no showed today's appt. Please advise on how to follow up. °A. No follow up necessary. °B. Follow up urgent. Contact patient immediately. °C. Follow up necessary. Contact patient and schedule visit in ___ days. °D. Follow up advised. Contact patient and schedule visit in ____weeks. ° °

## 2017-03-30 DIAGNOSIS — E119 Type 2 diabetes mellitus without complications: Secondary | ICD-10-CM | POA: Diagnosis not present

## 2017-03-31 ENCOUNTER — Ambulatory Visit: Payer: BLUE CROSS/BLUE SHIELD | Admitting: Internal Medicine

## 2017-03-31 DIAGNOSIS — Z0289 Encounter for other administrative examinations: Secondary | ICD-10-CM

## 2017-04-14 ENCOUNTER — Encounter: Payer: Self-pay | Admitting: Family Medicine

## 2017-04-14 ENCOUNTER — Ambulatory Visit (INDEPENDENT_AMBULATORY_CARE_PROVIDER_SITE_OTHER): Payer: BLUE CROSS/BLUE SHIELD | Admitting: Family Medicine

## 2017-04-14 VITALS — BP 108/60 | HR 76 | Temp 98.4°F | Resp 17 | Ht 66.0 in | Wt 128.6 lb

## 2017-04-14 DIAGNOSIS — Z23 Encounter for immunization: Secondary | ICD-10-CM

## 2017-04-14 DIAGNOSIS — B360 Pityriasis versicolor: Secondary | ICD-10-CM | POA: Diagnosis not present

## 2017-04-14 DIAGNOSIS — J301 Allergic rhinitis due to pollen: Secondary | ICD-10-CM | POA: Diagnosis not present

## 2017-04-14 MED ORDER — CETIRIZINE HCL 10 MG PO TABS: 10.0000 mg | ORAL_TABLET | Freq: Every day | ORAL | 11 refills | Status: DC

## 2017-04-14 MED ORDER — BENZOYL PEROXIDE 5 % EX LIQD: Freq: Two times a day (BID) | CUTANEOUS | 3 refills | Status: DC

## 2017-04-14 MED ORDER — FLUCONAZOLE 150 MG PO TABS: 300.0000 mg | ORAL_TABLET | Freq: Once | ORAL | 0 refills | Status: AC

## 2017-04-14 MED ORDER — FLUTICASONE PROPIONATE 50 MCG/ACT NA SUSP: 2.0000 | Freq: Every day | NASAL | 6 refills | Status: DC

## 2017-04-14 NOTE — Patient Instructions (Addendum)
   IF you received an x-ray today, you will receive an invoice from [facility]. Please contact [facility] at [phone] with questions or concerns regarding your invoice.   IF you received labwork today, you will receive an invoice from [facility]. Please contact [facility] at [phone] with questions or concerns regarding your invoice.   Our billing staff will not be able to assist you with questions regarding bills from these companies.  You will be contacted with the lab results as soon as they are available. The fastest way to get your results is to activate your My Chart account. Instructions are located on the last page of this paperwork. If you have not heard from us regarding the results in 2 weeks, please contact this office.    Tinea Versicolor Tinea versicolor is a common fungal infection of the skin. It causes a rash that appears as light or dark patches on the skin. The rash most often occurs on the chest, back, neck, or upper arms. This condition is more common during warm weather. Other than affecting how your skin looks, tinea versicolor usually does not cause other problems. In most cases, the infection goes away in a few weeks with treatment. It may take a few months for the patches on your skin to clear up. What are the causes? Tinea versicolor occurs when a type of fungus that is normally present on the skin starts to overgrow. This fungus is a kind of yeast. The exact cause of the overgrowth is not known. This condition cannot be passed from one person to another (noncontagious). What increases the risk? This condition is more likely to develop when certain factors are present, such as:  Heat and humidity.  Sweating too much.  Hormone changes.  Oily skin.  A weak defense (immune) system.  What are the signs or symptoms? Symptoms of this condition may include:  A rash on your skin that is made up of light or dark patches. The rash may  have: ? Patches of tan or pink spots on light skin. ? Patches of white or brown spots on dark skin. ? Patches of skin that do not tan. ? Well-marked edges. ? Scales on the discolored areas.  Mild itching.  How is this diagnosed? A health care provider can usually diagnose this condition by looking at your skin. During the exam, he or she may use ultraviolet light to help determine the extent of the infection. In some cases, a skin sample may be taken by scraping the rash. This sample will be viewed under a microscope to check for yeast overgrowth. How is this treated? Treatment for this condition may include:  Dandruff shampoo that is applied to the affected skin during showers or bathing.  Over-the-counter medicated skin cream, lotion, or soaps.  Prescription antifungal medicine in the form of skin cream or pills.  Medicine to help reduce itching.  Follow these instructions at home:  Take medicines only as directed by your health care provider.  Apply dandruff shampoo to the affected area if told to do so by your health care provider. You may be instructed to scrub the affected skin for several minutes each day.  Do not scratch the affected area of skin.  Avoid hot and humid conditions.  Do not use tanning booths.  Try to avoid sweating a lot. Contact a health care provider if:  Your symptoms get worse.  You have a fever.  You have redness, swelling, or pain at the site   of your rash.  You have fluid, blood, or pus coming from your rash.  Your rash returns after treatment. This information is not intended to replace advice given to you by your health care provider. Make sure you discuss any questions you have with your health care provider. Document Released: 07/03/2000 Document Revised: 03/08/2016 Document Reviewed: 04/17/2014 Elsevier Interactive Patient Education  2018 Elsevier Inc.  

## 2017-04-14 NOTE — Progress Notes (Signed)
Chief Complaint  Patient presents with  . vitigo    change of color of skin x 2 months    HPI Skin changes Pt reports that for 2 months he has had an itchy rash with lightening of the skin on his neck It seems to be spreading He sweats a lot He is concerned he has vitiligo   Allergic Rhinitis: Hilda Wexler is here for evaluation of possible allergic rhinitis. Patient's symptoms include clear rhinorrhea, itchy eyes, itchy nose and nasal congestion. These symptoms are seasonal. Current triggers include exposure to pollens. The patient has been suffering from these symptoms for approximately 2 weeks. He has not tried anything. Takes no regular meds No history of eczema  Past Medical History:  Diagnosis Date  . Diabetes mellitus without complication [facility])     Current Outpatient Prescriptions  Medication Sig Dispense Refill  . BAYER CONTOUR NEXT TEST test strip USE TO TEST BLOOD SUGAR FIVE TIMES DAILY AS DIRECTED 150 each 5  . BAYER MICROLET LANCETS lancets Use to test blood sugar 6-8 times daily as instructed. Dx: E10.9 300 each 5  . glucagon (GLUCAGON EMERGENCY) 1 MG injection Inject 1 mg into the muscle once as needed. 1 each 12  . insulin aspart (NOVOLOG) 100 UNIT/ML injection ADMINISTER 60 UNITS VIA INSULIN PUMP DAILY 90 mL 3  . Insulin Detemir (LEVEMIR FLEXTOUCH) 100 UNIT/ML Pen Inject 22 units into the skin at bedtime. 15 mL 11  . Insulin Human (INSULIN PUMP) SOLN Inject into the skin.    . Insulin Pen Needle (CAREFINE PEN NEEDLES) 32G X 4 MM MISC Use 4x a day 200 each 3  . Multiple Vitamin (MULTIVITAMIN) tablet Take 1 tablet by mouth daily.    . benzoyl peroxide (BENZOYL PEROXIDE) 5 % external liquid Apply topically 2 (two) times daily. 142 g 3  . cetirizine (ZYRTEC) 10 MG tablet Take 1 tablet (10 mg total) by mouth daily. 30 tablet 11  . fluconazole (DIFLUCAN) 150 MG tablet Take 2 tablets (300 mg total) by mouth once. Repeat dose in one week 4 tablet 0  . fluticasone (FLONASE)  50 MCG/ACT nasal spray Place 2 sprays into both nostrils daily. 16 g 6  . VITAMIN D, ERGOCALCIFEROL, PO Take 2 tablets by mouth daily.     No current facility-administered medications for this visit.     Allergies: No Known Allergies  Past Surgical History:  Procedure Laterality Date  . TONSILLECTOMY  2001    Social History   Social History  . Marital status: Single    Spouse name: N/A  . Number of children: N/A  . Years of education: N/A   Social History Main Topics  . Smoking status: Never Smoker  . Smokeless tobacco: Never Used  . Alcohol use No  . Drug use: No  . Sexual activity: Not Asked   Other Topics Concern  . None   Social History Narrative   Arrived in [place]:     Date: early April 2015   Home country: [place]   Reason for leaving: fled [place] as family worked for [place] company      Language:     Arabic   Does Futures trader        Education:     8th grade; worked in [employer] History:     Vaccinations: unknown    ROS Review of Systems See HPI Constitution: No fevers or chills No malaise No diaphoresis Skin: see hpi Eyes: no  blurry vision, no double vision GU: no dysuria or hematuria Neuro: no dizziness or headaches   Objective: Vitals:   04/14/17 0843  BP: 108/60  Pulse: 76  Resp: 17  Temp: 98.4 F (36.9 C)  TempSrc: Oral  SpO2: 98%  Weight: 128 lb 9.6 oz (58.3 kg)  Height:  (1.676 m)    Physical Exam  Constitutional: He is oriented to person, place, and time. He appears well-developed and well-nourished.  HENT:  Head: Normocephalic and atraumatic.  Eyes: Conjunctivae and EOM are normal.  Cardiovascular: Normal rate, regular rhythm and normal heart sounds.   Pulmonary/Chest: Effort normal and breath sounds normal. No respiratory distress. He has no wheezes.  Neurological: He is oriented to person, place, and time.  Psychiatric: He has a normal mood and affect. His behavior is normal. Judgment and thought  content normal.    Skin: hypopigmented flat lesion on neck No erythema No vesicle  Assessment and Plan Sherwin was seen today for vitigo.  Diagnoses and all orders for this visit:  Tinea versicolor- will treat with diflucan Discussed skin care  Need for prophylactic vaccination against combinations of diseases -     Flu Vaccine QUAD 36+ mos IM  Seasonal allergic rhinitis due to pollen- prescribed antihistamine and flonase  Other orders -     Cancel: Pneumococcal conjugate vaccine 13-valent IM -     Pneumococcal polysaccharide vaccine 23-valent greater than or equal to 2yo subcutaneous/IM -     fluconazole (DIFLUCAN) 150 MG tablet; Take 2 tablets (300 mg total) by mouth once. Repeat dose in one week -     benzoyl peroxide (BENZOYL PEROXIDE) 5 % external liquid; Apply topically 2 (two) times daily. -     cetirizine (ZYRTEC) 10 MG tablet; Take 1 tablet (10 mg total) by mouth daily. -     fluticasone (FLONASE) 50 MCG/ACT nasal spray; Place 2 sprays into both nostrils daily.     Joane Postel A Teya Otterson

## 2017-04-27 ENCOUNTER — Ambulatory Visit (INDEPENDENT_AMBULATORY_CARE_PROVIDER_SITE_OTHER): Payer: BLUE CROSS/BLUE SHIELD | Admitting: Physician Assistant

## 2017-04-27 ENCOUNTER — Encounter: Payer: Self-pay | Admitting: Physician Assistant

## 2017-04-27 VITALS — BP 120/80 | HR 69 | Temp 98.2°F | Resp 16 | Ht 66.0 in | Wt 128.4 lb

## 2017-04-27 DIAGNOSIS — M79644 Pain in right finger(s): Secondary | ICD-10-CM

## 2017-04-27 DIAGNOSIS — B354 Tinea corporis: Secondary | ICD-10-CM

## 2017-04-27 MED ORDER — NAPROXEN 500 MG PO TABS: 500.0000 mg | ORAL_TABLET | Freq: Two times a day (BID) | ORAL | 0 refills | Status: DC

## 2017-04-27 MED ORDER — CLOTRIMAZOLE 1 % EX CREA: 1.0000 "application " | TOPICAL_CREAM | Freq: Two times a day (BID) | CUTANEOUS | 0 refills | Status: DC

## 2017-04-27 NOTE — Progress Notes (Signed)
Barry Potter  MRN: [id number] DOB: [birth year]  PCP: Marquette Saa, MD  Subjective:  Pt is a 31 year old male who presents to clinic for hand pain and rash on his neck.  He works as a Curator, c/o pain of right middle finger. Worse with working long days and repetitive movements. He has not taken anything for pain. Denies MOI.  He was here for this same c/c 04/2015 - treated with Meloxicam - this did not help. Denies swelling, redness, weakness, n/t.   Noticed white spots on his neck the past few weeks. He has not taken anyting for this. Never has this before. A few people at his work have similar symptoms. Denies itching, pain, swelling, drainage.   Review of Systems  Constitutional: Negative for chills, diaphoresis and fever.  Musculoskeletal: Positive for arthralgias (right 3rd digit). Negative for joint swelling.  Skin: Positive for rash (neck).  Neurological: Negative for weakness and numbness.    Patient Active Problem List   Diagnosis Date Noted  . Uncontrolled type 1 diabetes mellitus without complication (HCC) 08/02/2015  . Diabetic retinopathy (HCC) 12/04/2013  . Refugee health examination 12/04/2013    Current Outpatient Prescriptions on File Prior to Visit  Medication Sig Dispense Refill  . BAYER CONTOUR NEXT TEST test strip USE TO TEST BLOOD SUGAR FIVE TIMES DAILY AS DIRECTED 150 each 5  . BAYER MICROLET LANCETS lancets Use to test blood sugar 6-8 times daily as instructed. Dx: E10.9 300 each 5  . benzoyl peroxide (BENZOYL PEROXIDE) 5 % external liquid Apply topically 2 (two) times daily. 142 g 3  . cetirizine (ZYRTEC) 10 MG tablet Take 1 tablet (10 mg total) by mouth daily. 30 tablet 11  . fluticasone (FLONASE) 50 MCG/ACT nasal spray Place 2 sprays into both nostrils daily. 16 g 6  . insulin aspart (NOVOLOG) 100 UNIT/ML injection ADMINISTER 60 UNITS VIA INSULIN PUMP DAILY 90 mL 3  . Insulin Detemir (LEVEMIR FLEXTOUCH) 100 UNIT/ML Pen Inject 22 units into  the skin at bedtime. 15 mL 11  . Insulin Human (INSULIN PUMP) SOLN Inject into the skin.    . Insulin Pen Needle (CAREFINE PEN NEEDLES) 32G X 4 MM MISC Use 4x a day 200 each 3  . Multiple Vitamin (MULTIVITAMIN) tablet Take 1 tablet by mouth daily.    Marland Kitchen VITAMIN D, ERGOCALCIFEROL, PO Take 2 tablets by mouth daily.    Marland Kitchen glucagon (GLUCAGON EMERGENCY) 1 MG injection Inject 1 mg into the muscle once as needed. (Patient not taking: Reported on 04/27/2017) 1 each 12   No current facility-administered medications on file prior to visit.     No Known Allergies   Objective:  BP 120/80   Pulse 69   Temp 98.2 F (36.8 C) (Oral)   Resp 16   Ht  (1.676 m)   Wt 128 lb 6.4 oz (58.2 kg)   SpO2 100%   BMI 20.72 kg/m   Physical Exam  Constitutional: He is oriented to person, place, and time and well-developed, well-nourished, and in no distress. No distress.  Cardiovascular: Normal rate, regular rhythm and normal heart sounds.   Musculoskeletal:  Right 3rd digit - no swelling, erythema or warmth. Not TTP. No reduced ROM. Sensation in tact.   Neurological: He is alert and oriented to person, place, and time. GCS score is 15.  Skin: Skin is warm and dry. Rash noted.     Hypopigmented 0.5-1 cm patches on neck, left of midline extending to posterior neck.  Psychiatric: Mood, memory, affect and judgment normal.  Vitals reviewed.   Assessment and Plan :  1. Tinea corporis - clotrimazole (LOTRIMIN) 1 % cream; Apply 1 application topically 2 (two) times daily.  Dispense: 30 g; Refill: 0 - Tinea vs viteligo. Plan to treat for tinea at this time. RTC if no improvement - consider work-up vitiligo.  2. Pain of finger of right hand - naproxen (NAPROSYN) 500 MG tablet; Take 1 tablet (500 mg total) by mouth 2 (two) times daily with a meal. As needed for pain  Dispense: 60 tablet; Refill: 0 - pain 2/2 physical nature of his job. Advised rest and ice.   Marco Collie, PA-C  Primary Care at  [facility] 04/27/2017 8:10 AM

## 2017-04-27 NOTE — Patient Instructions (Addendum)
  Take Naprosyn when you have finger pain.  Use Lotrimin cream twice daily for who weeks.   Come back if you are not better in 3-4 weeks.   Thank you for coming in today. I hope you feel we met your needs.  Feel free to call PCP if you have any questions or further requests.  Please consider signing up for MyChart if you do not already have it, as this is a great way to communicate with me.  Best,  Whitney McVey, PA-C  IF you received an x-ray today, you will receive an invoice from [facility]. Please contact [facility] at [phone] with questions or concerns regarding your invoice.   IF you received labwork today, you will receive an invoice from [facility]. Please contact [facility] at [phone] with questions or concerns regarding your invoice.   Our billing staff will not be able to assist you with questions regarding bills from these companies.  You will be contacted with the lab results as soon as they are available. The fastest way to get your results is to activate your My Chart account. Instructions are located on the last page of this paperwork. If you have not heard from [place] regarding the results in 2 weeks, please contact this office.

## 2017-05-19 ENCOUNTER — Other Ambulatory Visit: Payer: Self-pay | Admitting: Internal Medicine

## 2017-06-21 ENCOUNTER — Other Ambulatory Visit: Payer: Self-pay | Admitting: Internal Medicine

## 2017-07-01 ENCOUNTER — Encounter: Payer: Self-pay | Admitting: Internal Medicine

## 2017-07-01 ENCOUNTER — Ambulatory Visit: Payer: BLUE CROSS/BLUE SHIELD | Admitting: Internal Medicine

## 2017-07-01 VITALS — BP 138/90 | HR 78 | Ht 66.0 in | Wt 130.0 lb

## 2017-07-01 DIAGNOSIS — IMO0001 Reserved for inherently not codable concepts without codable children: Secondary | ICD-10-CM

## 2017-07-01 DIAGNOSIS — E1065 Type 1 diabetes mellitus with hyperglycemia: Secondary | ICD-10-CM

## 2017-07-01 LAB — POCT GLYCOSYLATED HEMOGLOBIN (HGB A1C): Hemoglobin A1C: 6.5

## 2017-07-01 MED ORDER — GLUCOSE BLOOD VI STRP: ORAL_STRIP | 11 refills | Status: AC

## 2017-07-01 MED ORDER — INSULIN ASPART 100 UNIT/ML ~~LOC~~ SOLN: SUBCUTANEOUS | 3 refills | Status: DC

## 2017-07-01 MED ORDER — BAYER MICROLET LANCETS MISC: 5 refills | Status: DC

## 2017-07-01 NOTE — Patient Instructions (Addendum)
Please continue: - basal rates: 12 am: 0.9 units/h 4 am: 1.0 12 pm: 0.7 6 pm: 0.9 - ICR:  12 am: 12 11 am: 13  7 pm: 11 - target: 110-110 - ISF:  12 am - 10 am: 70 10 am - 12 am: 55 - Insulin on Board: 4h - bolus wizard: on  Please check sugars, enter the value in the pump, enter the carbs and start the bolus 15 min before each meal or snack.  Please use 15 g carbs to correct a blood sugar >60. Use 30g carbs for lower sugars.

## 2017-07-01 NOTE — Progress Notes (Signed)
Patient ID: Barry Potter, male   DOB: 24-Jun-1986, 31 y.o.   MRN: [id number]  HPI: Barry Potter is a 31 y.o.-year-old male, initially referred by his PCP, Dr. Bridgett Larsson, for management of DM1, dx 2002, uncontrolled, with complications (mild DR). He returns for f/u. Last visit 9 mo ago. He now speaks good English so we did not need the Arabic interpreter. Insurance:BCBS.  He is on an insulin pump now: Medtronic Minimed 522 since 09/2014. No CGM.  Last hemoglobin A1c was: Lab Results  Component Value Date   HGBA1C 6.0 09/23/2016   HGBA1C 6.7 08/02/2015   HGBA1C 6.6 (H) 05/15/2014   Pump Settings: - basal rates: 12 am: 0.9 units/h 4-8 am: 0.9 >> 1.0 12 pm: 0.8 >> 0.7 6 pm: 1.0 >> 0.9 - ICR:  12 am: 12 11 am: 15 >> 13  7 pm: 10 >> 11 - target: 110-110 - ISF:  12 am - 10 am: 70 10 am - 12 am: 70 >> 55 - Insulin on Board: 4h - bolus wizard: on TDD from basal insulin: 21.6 units (47%) >> 52% >> 55% TDD from bolus insulin: 24.5 units (53%) >> 48% 45%>>  Total daily dose: Up to 50 units a day - extended bolusing: not using - changes infusion site: q3-4 days - Meter: Bayer 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   Pt checks his sugars 5.9x a day >> ave 157 +/- 90 >> now 170+/-98.  We will scan the pump download reports.   Lowest sugar was 60 >> 54 (after correction of high CBGs usually) >> 45; he has hypoglycemia awareness at 60.  No previous hypoglycemia admission. He does have a glucagon kit at home. Highest sugar was 300's >> >400 >> 415 (w/o bolusing) He was admitted in 2005 for high sugars ? DKA.  Pt's meals are: - am: egg, tea, bread - largest meal of the day - lunch: rice, meat, salad - dinner: yoghurt, salad, rice - smallest meal of the day - Snacks: 3x or more  He works 6 am-6 pm.  - No CKD, last BUN/creatinine:  Lab Results  Component Value Date   BUN 15 09/23/2016   CREATININE 0.81 09/23/2016   -No HL: Lab Results  Component Value Date   CHOL 125  09/23/2016   HDL 54.00 09/23/2016   LDLCALC 42 09/23/2016   TRIG 143.0 09/23/2016   CHOLHDL 2 09/23/2016   - last eye exam was in 07/2016. Prev. + R DR - mild >> now no DR repeatedly. - denies numbness and tingling in his feet.  ROS: Constitutional: no weight gain/no weight loss, no fatigue, no subjective hyperthermia, no subjective hypothermia Eyes: no blurry vision, no xerophthalmia ENT: no sore throat, no nodules palpated in throat, no dysphagia, no odynophagia, no hoarseness Cardiovascular: no CP/no SOB/no palpitations/no leg swelling Respiratory: no cough/no SOB/no wheezing Gastrointestinal: no N/no V/no D/no C/no acid reflux Musculoskeletal: no muscle aches/no joint aches Skin: no rashes, no hair loss Neurological: no tremors/no numbness/no tingling/no dizziness  I reviewed pt's medications, allergies, PMH, social hx, family hx, and changes were documented in the history of present illness. Otherwise, unchanged from my initial visit note.  PE: BP 138/90   Pulse 78   Ht _0  (1.676 m)   Wt 130 lb (59 kg)   SpO2 97%   BMI 20.98 kg/m  Body mass index is 20.98 kg/m. Wt Readings from Last 3 Encounters:  07/01/17 130 lb (59 kg)  04/27/17 128  lb 6.4 oz (58.2 kg)  04/14/17 128 lb 9.6 oz (58.3 kg)   Constitutional: normal weight, in NAD Eyes: PERRLA, EOMI, no exophthalmos ENT: moist mucous membranes, no thyromegaly, no cervical lymphadenopathy Cardiovascular: RRR, No MRG Respiratory: CTA B Gastrointestinal: abdomen soft, NT, ND, BS+ Musculoskeletal: no deformities, strength intact in all 4 Skin: moist, warm, no rashes Neurological: no tremor with outstretched hands, DTR normal in all 4  ASSESSMENT: 1. DM1, uncontrolled, without long term complications, but with hypoglycemia  PLAN:  1. Patient with long-standing type 1 diabetes, on insulin pump, with multiple low blood sugars in the past due to noncompliance with the recommended dosing.  Also not compliant with  appointments and has multiple no shows. - In the morning and decrease to monthly during the day, lunch and increased along with dinner.  I also decrease sensitivity factor from 10 AM to 12 AM - He is doing a good job checking his sugars frequently, bolusing with meals and changing his infusion site every 3 days. - he was using phantom carbs frequently at last visit to allow him to bolus more he does not do this as often, after we changed.  His pump settings at last visit. - However, the biggest barrier to good control is that he is not bolusing at the beginning of the meal right now but after he started eating.  He checks his sugars after he ate, enters the carbs and boluses based on the bolus wizard recommendations.  Therefore, she sugars are extremely fluctuating at this point with very large postprandial hyperglycemic excursions followed by dramatic decrease using his sugars after correction of these highs. - I highly recommended at this visit to try to move the bolus 15 minutes before the meal.  He needs to check his sugars at that time, enter the number of carbs in the pump and start the bolus.  He can then eat 15 minutes later.  He is not doing this now but promises me that he will start doing so - We also discussed about correcting his low blood sugars.  He is now drinking an entire bottle of juice if his sugars are low and, as expected, subsequent sugars are in the 400s.  We discussed about treating lows with 15-30 g of carbs.  Given the 15-15 rule handout.  If - I advised him to: Patient Instructions  Please continue: - basal rates: 12 am: 0.9 units/h 4 am: 1.0 12 pm: 0.7 6 pm: 0.9 - ICR:  12 am: 12 11 am: 13  7 pm: 11 - target: 110-110 - ISF:  12 am - 10 am: 70 10 am - 12 am: 55 - Insulin on Board: 4h - bolus wizard: on  Please check sugars, enter the value in the pump, enter the carbs and start the bolus 15 min before each meal or snack.  Please use 15 g carbs to correct a  blood sugar >60. Use 30g carbs for lower sugars.  - today, HbA1c is 6.5% (higher) - continue checking sugars at different times of the day - check 4x a day, rotating checks - advised for yearly eye exams >> he is UTD  - UTD with flu shot - pt will not return to clinic after this visit >> he is discharged 2/2 multiple no shows and as he failed to follow up as advised.  - time spent with the patient: 40 min, of which >50% was spent in reviewing his pump downloads, discussing his hypo- and hyper-glycemic episodes, reviewing  previous labs and pump settings and developing a plan to avoid hypo- and hyper-glycemia.   Philemon Kingdom, MD PhD [facility]

## 2017-07-06 ENCOUNTER — Telehealth: Payer: Self-pay | Admitting: Internal Medicine

## 2017-07-06 NOTE — Telephone Encounter (Signed)
Patient dismissed from [facility] by Cristina Gherghe MD , effective July 01, 2017. Dismissal letter sent out by certified / registered mail.  °daj °

## 2017-07-28 NOTE — Telephone Encounter (Signed)
Received signed domestic return receipt verifying delivery of certified letter on July 26, 2017. Article number [id number] [phone] daj

## 2017-09-06 DIAGNOSIS — E119 Type 2 diabetes mellitus without complications: Secondary | ICD-10-CM | POA: Diagnosis not present

## 2017-09-23 DIAGNOSIS — E119 Type 2 diabetes mellitus without complications: Secondary | ICD-10-CM | POA: Diagnosis not present

## 2017-10-03 ENCOUNTER — Other Ambulatory Visit: Payer: Self-pay | Admitting: Internal Medicine

## 2017-10-04 ENCOUNTER — Ambulatory Visit: Payer: BLUE CROSS/BLUE SHIELD | Admitting: Family Medicine

## 2017-10-04 ENCOUNTER — Encounter: Payer: Self-pay | Admitting: Family Medicine

## 2017-10-04 VITALS — BP 110/68 | HR 91 | Temp 98.7°F | Ht 64.17 in | Wt 127.4 lb

## 2017-10-04 DIAGNOSIS — IMO0001 Reserved for inherently not codable concepts without codable children: Secondary | ICD-10-CM

## 2017-10-04 DIAGNOSIS — E1065 Type 1 diabetes mellitus with hyperglycemia: Secondary | ICD-10-CM

## 2017-10-04 DIAGNOSIS — Z9641 Presence of insulin pump (external) (internal): Secondary | ICD-10-CM

## 2017-10-04 MED ORDER — INSULIN ASPART 100 UNIT/ML ~~LOC~~ SOLN: SUBCUTANEOUS | 1 refills | Status: AC

## 2017-10-04 NOTE — Progress Notes (Signed)
3/18/20193:45 PM  Barry Potter 04-22-1986, 32 y.o. male [id number]  Chief Complaint  Patient presents with  . Medication Refill    needing strips ans insulin refill. Says he already did eye exam last year. Does not remember when    HPI:   Patient is a 32 y.o. male with past medical history significant for DMtpye 1 on insulin pump who presents today to establish care  He needs refills of novolog, he is currently on his last cartidge His last a1c 6.5 done dec 2018 Was dismissed from [facility] for no shows Patient reports that appointments were given 3 months in advance and he would forget Diagnosed with DM1 around age 13, on pump for 2 years Patient reports that he tends to go down to 60-70s on busy work days when he forgets to eat, he does get symptoms of hypoglycemia He tends to go > 250 on days when he is off and might over indulge  In general he reports cbgs are controlled He does use bolus wizard  Per last endo note: Patient Instructions  Please continue: - basal rates: 12 am: 0.9 units/h 4 am: 1.0 12 pm: 0.7 6 pm: 0.9 - ICR:  12 am: 12 11 am: 13  7 pm: 11 - target: 110-110 - ISF:  12 am - 10 am: 70 10 am - 12 am: 55 - Insulin on Board: 4h - bolus wizard: on  Please check sugars, enter the value in the pump, enter the carbs and start the bolus 15 min before each meal or snack.  Please use 15 g carbs to correct a blood sugar >60. Use 30g carbs for lower sugars.    Depression screen [facility] 2/9 10/04/2017 04/27/2017 04/14/2017  Decreased Interest 0 0 0  Down, Depressed, Hopeless 0 0 0  PHQ - 2 Score 0 0 0    No Known Allergies  Prior to Admission medications   Medication Sig Start Date End Date Taking? Authorizing Provider  glucose blood (CONTOUR NEXT TEST) test strip USE TO TEST BLOOD SUGAR LEVELS FIVE TIMES DAILY AS DIRECTED 07/01/17  Yes Carlus Pavlov, MD  insulin aspart (NOVOLOG) 100 UNIT/ML injection ADMINISTER 60 UNITS VIA INSULIN PUMP DAILY  07/01/17  Yes Carlus Pavlov, MD  Insulin Human (INSULIN PUMP) SOLN Inject into the skin.   Yes [provider]  BAYER MICROLET LANCETS lancets Use to test blood sugar 6-8 times daily as instructed. Dx: E10.9 Patient not taking: Reported on 10/04/2017 07/01/17   Carlus Pavlov, MD  benzoyl peroxide (BENZOYL PEROXIDE) 5 % external liquid Apply topically 2 (two) times daily. Patient not taking: Reported on 10/04/2017 04/14/17   Doristine Bosworth, MD  cetirizine (ZYRTEC) 10 MG tablet Take 1 tablet (10 mg total) by mouth daily. Patient not taking: Reported on 10/04/2017 04/14/17   Doristine Bosworth, MD  clotrimazole (LOTRIMIN) 1 % cream Apply 1 application topically 2 (two) times daily. Patient not taking: Reported on 10/04/2017 04/27/17   McVey, Madelaine Bhat, PA-C  fluticasone [facility]) 50 MCG/ACT nasal spray Place 2 sprays into both nostrils daily. Patient not taking: Reported on 10/04/2017 04/14/17   Doristine Bosworth, MD  glucagon (GLUCAGON EMERGENCY) 1 MG injection Inject 1 mg into the muscle once as needed. Patient not taking: Reported on 10/04/2017 09/23/16   Carlus Pavlov, MD  Insulin Detemir (LEVEMIR FLEXTOUCH) 100 UNIT/ML Pen Inject 22 units into the skin at bedtime. Patient not taking: Reported on 10/04/2017 08/01/14   Carlus Pavlov, MD  Insulin Pen Needle (  CAREFINE PEN NEEDLES) 32G X 4 MM MISC Use 4x a day Patient not taking: Reported on 10/04/2017 01/22/14   Carlus PavlovGherghe, Cristina, MD  Multiple Vitamin (MULTIVITAMIN) tablet Take 1 tablet by mouth daily.    [provider]  naproxen (NAPROSYN) 500 MG tablet Take 1 tablet (500 mg total) by mouth 2 (two) times daily with a meal. As needed for pain Patient not taking: Reported on 10/04/2017 04/27/17   McVey, Madelaine BhatElizabeth Whitney, PA-C  VITAMIN D, ERGOCALCIFEROL, PO Take 2 tablets by mouth daily.    [provider]    Past Medical History:  Diagnosis Date  . Diabetes mellitus without complication [facility](HCC)     Past  Surgical History:  Procedure Laterality Date  . TONSILLECTOMY  2001    Social History   Tobacco Use  . Smoking status: Never Smoker  . Smokeless tobacco: Never Used  Substance Use Topics  . Alcohol use: No    Family History  Problem Relation Age of Onset  . Diabetes Sister     Review of Systems  Constitutional: Negative for chills and fever.  Respiratory: Negative for cough and shortness of breath.   Cardiovascular: Negative for chest pain, palpitations and leg swelling.  Gastrointestinal: Negative for abdominal pain, nausea and vomiting.     OBJECTIVE:  Blood pressure 110/68, pulse 91, temperature 98.7 F (37.1 C), temperature source Oral, height 5' 4.17" (1.63 m), weight 127 lb 6.4 oz (57.8 kg), SpO2 99 %.  Physical Exam  Constitutional: He is oriented to person, place, and time and well-developed, well-nourished, and in no distress.  HENT:  Head: Normocephalic and atraumatic.  Mouth/Throat: Oropharynx is clear and moist.  Eyes: EOM are normal. Pupils are equal, round, and reactive to light.  Neck: Neck supple.  Pulmonary/Chest: Effort normal.  Neurological: He is alert and oriented to person, place, and time. Gait normal.  Skin: Skin is warm and dry.  Psychiatric: Mood and affect normal.  Nursing note and vitals reviewed.   ASSESSMENT and PLAN  1. Uncontrolled type 1 diabetes mellitus without complication (HCC) Refilling patients insulin today. Was very clear with patient that I will not manage his DM, that I will refer him to a specialist. I also stressed importance of keeping medical appointments.  - Ambulatory referral to Endocrinology  2. Insulin pump in place - Ambulatory referral to Endocrinology  Other orders - insulin aspart (NOVOLOG) 100 UNIT/ML injection; ADMINISTER 60 UNITS VIA INSULIN PUMP DAILY  Return in about 3 months (around 01/04/2018).    Myles LippsIrma M Santiago, MD Primary Care at [facility] [address] [place], [place] [address] Ph.   [phone] Fax [phone]

## 2017-10-04 NOTE — Patient Instructions (Signed)
     IF you received an x-ray today, you will receive an invoice from [facility]. Please contact [facility] at [phone] with questions or concerns regarding your invoice.   IF you received labwork today, you will receive an invoice from [facility]. Please contact [facility] at [phone] with questions or concerns regarding your invoice.   Our billing staff will not be able to assist you with questions regarding bills from these companies.  You will be contacted with the lab results as soon as they are available. The fastest way to get your results is to activate your My Chart account. Instructions are located on the last page of this paperwork. If you have not heard from us regarding the results in 2 weeks, please contact this office.     

## 2017-11-10 ENCOUNTER — Telehealth: Payer: Self-pay

## 2017-11-10 DIAGNOSIS — E109 Type 1 diabetes mellitus without complications: Secondary | ICD-10-CM | POA: Diagnosis not present

## 2017-11-10 NOTE — Telephone Encounter (Signed)
Sp[facility] with Barry Potter at [facility], requesting labs for pt. Faxed to [phone]. Has appt today with [facility].

## 2017-12-15 DIAGNOSIS — E109 Type 1 diabetes mellitus without complications: Secondary | ICD-10-CM | POA: Diagnosis not present

## 2018-01-07 ENCOUNTER — Ambulatory Visit: Payer: BLUE CROSS/BLUE SHIELD | Admitting: Family Medicine

## 2018-01-10 DIAGNOSIS — E109 Type 1 diabetes mellitus without complications: Secondary | ICD-10-CM | POA: Diagnosis not present

## 2018-02-11 ENCOUNTER — Other Ambulatory Visit: Payer: Self-pay | Admitting: Internal Medicine

## 2018-02-11 ENCOUNTER — Other Ambulatory Visit: Payer: Self-pay | Admitting: Physician Assistant

## 2018-02-11 DIAGNOSIS — M79644 Pain in right finger(s): Secondary | ICD-10-CM

## 2018-03-14 DIAGNOSIS — E109 Type 1 diabetes mellitus without complications: Secondary | ICD-10-CM | POA: Diagnosis not present

## 2018-03-14 DIAGNOSIS — Z6820 Body mass index (BMI) 20.0-20.9, adult: Secondary | ICD-10-CM | POA: Diagnosis not present

## 2018-03-30 DIAGNOSIS — E119 Type 2 diabetes mellitus without complications: Secondary | ICD-10-CM | POA: Diagnosis not present

## 2018-04-14 DIAGNOSIS — E119 Type 2 diabetes mellitus without complications: Secondary | ICD-10-CM | POA: Diagnosis not present

## 2018-06-08 DIAGNOSIS — E109 Type 1 diabetes mellitus without complications: Secondary | ICD-10-CM | POA: Diagnosis not present

## 2018-06-08 DIAGNOSIS — Z6820 Body mass index (BMI) 20.0-20.9, adult: Secondary | ICD-10-CM | POA: Diagnosis not present

## 2018-06-24 ENCOUNTER — Other Ambulatory Visit: Payer: Self-pay | Admitting: Family Medicine

## 2018-06-24 NOTE — Telephone Encounter (Signed)
Requested medication (s) are due for refill today: yes  Requested medication (s) are on the active medication list: yes  Last refill:  10/04/17  Future visit scheduled: no  Notes to clinic:  See note of office visit 10/04/17  This med to be managed by specialist.     Requested Prescriptions  Pending Prescriptions Disp Refills   NOVOLOG 100 UNIT/ML injection [Pharmacy Med Name: NOVOLOG U-100 INSULIN VL10ML(ORANG)] 90 mL 0    Sig: ADMINISTER 60 UNITS UNDER THE SKIN VIA INSULIN PUMP DAILY     Endocrinology:  Diabetes - Insulins Failed - 06/24/2018 11:14 AM      Failed - HBA1C is between 0 and 7.9 and within 180 days    Hemoglobin A1C  Date Value Ref Range Status  07/01/2017 6.5  Final   Hgb A1c MFr Bld  Date Value Ref Range Status  05/15/2014 6.6 (H) 4.6 - 6.5 % Final    Comment:    Glycemic Control Guidelines for People with Diabetes:Non Diabetic:  <6%Goal of Therapy: <7%Additional Action Suggested:  >8%          Failed - Valid encounter within last 6 months    Recent Outpatient Visits          8 months ago Uncontrolled type 1 diabetes mellitus without complication [facility](HCC)   [facility] [place], Meda CoffeeIrma M, MD   1 year ago Tinea corporis   [facility] McVey, Madelaine BhatElizabeth Whitney, PA-C   1 year ago Tinea versicolor   [facility] Stallings, Manus RuddZoe A, MD   3 years ago Right hand pain   [facility] at [facility] Copland, Gwenlyn FoundJessica C, MD   3 years ago Immunization counseling   [facility] [place], Marolyn HammockMichael L, PA-C

## 2018-06-27 NOTE — Telephone Encounter (Signed)
Are medications being refilled or not until patient comes in?

## 2018-06-28 NOTE — Telephone Encounter (Signed)
Please advise 

## 2018-06-28 NOTE — Telephone Encounter (Signed)
Please call [facility]. He is seeing endocrinology now. [facility], please have them request refill from him. Patient might also need to let the pharmacy know. Thanks

## 2018-08-09 DIAGNOSIS — E109 Type 1 diabetes mellitus without complications: Secondary | ICD-10-CM | POA: Diagnosis not present

## 2018-08-09 DIAGNOSIS — Z6820 Body mass index (BMI) 20.0-20.9, adult: Secondary | ICD-10-CM | POA: Diagnosis not present

## 2018-09-30 DIAGNOSIS — E119 Type 2 diabetes mellitus without complications: Secondary | ICD-10-CM | POA: Diagnosis not present

## 2018-10-21 DIAGNOSIS — E119 Type 2 diabetes mellitus without complications: Secondary | ICD-10-CM | POA: Diagnosis not present

## 2018-12-08 DIAGNOSIS — Z6820 Body mass index (BMI) 20.0-20.9, adult: Secondary | ICD-10-CM | POA: Diagnosis not present

## 2018-12-08 DIAGNOSIS — E109 Type 1 diabetes mellitus without complications: Secondary | ICD-10-CM | POA: Diagnosis not present

## 2019-03-30 DIAGNOSIS — B354 Tinea corporis: Secondary | ICD-10-CM | POA: Diagnosis not present

## 2019-04-26 DIAGNOSIS — E119 Type 2 diabetes mellitus without complications: Secondary | ICD-10-CM | POA: Diagnosis not present

## 2019-05-22 DIAGNOSIS — B360 Pityriasis versicolor: Secondary | ICD-10-CM | POA: Diagnosis not present

## 2019-11-20 LAB — HEPATIC FUNCTION PANEL
ALT: 28 U/L (ref 10–40)
AST: 10 — AB (ref 14–40)
Alkaline Phosphatase: 51 (ref 25–125)
Bilirubin, Total: 0.7

## 2019-11-20 LAB — BASIC METABOLIC PANEL
BUN: 10 (ref 4–21)
CO2: 30 — AB (ref 13–22)
Chloride: 101 (ref 99–108)
Creatinine: 0.9 (ref 0.6–1.3)
Glucose: 253
Potassium: 4.2 mEq/L (ref 3.5–5.1)
Sodium: 138 (ref 137–147)

## 2019-11-20 LAB — COMPREHENSIVE METABOLIC PANEL
Albumin: 4 (ref 3.5–5.0)
Calcium: 9.4 (ref 8.7–10.7)
Globulin: 3.1

## 2019-11-20 LAB — TSH: TSH: 2.16 (ref 0.41–5.90)

## 2019-11-20 LAB — HEMOGLOBIN A1C: Hemoglobin A1C: 6.6

## 2020-02-15 ENCOUNTER — Other Ambulatory Visit: Payer: Self-pay

## 2020-02-15 ENCOUNTER — Ambulatory Visit (INDEPENDENT_AMBULATORY_CARE_PROVIDER_SITE_OTHER): Payer: BLUE CROSS/BLUE SHIELD

## 2020-02-15 ENCOUNTER — Encounter (HOSPITAL_COMMUNITY): Payer: Self-pay

## 2020-02-15 ENCOUNTER — Ambulatory Visit (HOSPITAL_COMMUNITY)
Admission: EM | Admit: 2020-02-15 | Discharge: 2020-02-15 | Disposition: A | Discharge status: Home / Self Care | Payer: BLUE CROSS/BLUE SHIELD | Attending: Internal Medicine | Admitting: Internal Medicine

## 2020-02-15 DIAGNOSIS — S41132A Puncture wound without foreign body of left upper arm, initial encounter: Secondary | ICD-10-CM | POA: Diagnosis not present

## 2020-02-15 DIAGNOSIS — S41142A Puncture wound with foreign body of left upper arm, initial encounter: Secondary | ICD-10-CM

## 2020-02-15 DIAGNOSIS — S40859A Superficial foreign body of unspecified upper arm, initial encounter: Secondary | ICD-10-CM

## 2020-02-15 DIAGNOSIS — S41129A Laceration with foreign body of unspecified upper arm, initial encounter: Secondary | ICD-10-CM | POA: Insufficient documentation

## 2020-02-15 MED ORDER — CEPHALEXIN 500 MG PO CAPS
500.0000 mg | ORAL_CAPSULE | Freq: Three times a day (TID) | ORAL | 0 refills | Status: AC
Start: 2020-02-15 — End: 2020-02-20

## 2020-02-15 MED ORDER — IBUPROFEN 600 MG PO TABS
600.0000 mg | ORAL_TABLET | Freq: Four times a day (QID) | ORAL | 0 refills | Status: DC | PRN
Start: 2020-02-15 — End: 2021-12-01

## 2020-02-15 NOTE — ED Triage Notes (Addendum)
Pt c/o small puncture wound to left upper arm, states he got stuck by a piece of metal in his [employer] shop. Pt states last tetanus was 2 years ago

## 2020-02-18 DIAGNOSIS — S41132A Puncture wound without foreign body of left upper arm, initial encounter: Secondary | ICD-10-CM | POA: Diagnosis not present

## 2020-02-18 NOTE — ED Provider Notes (Signed)
[facility]    CSN: [id number] Arrival date & time: 02/15/20  1051      History   Chief Complaint Chief Complaint  Patient presents with  . 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  Patient attempted to remove foreign body but was unsuccessful.  He decided to come to urgent care to have the foreign body removed.  Bleeding is controlled.  No numbness or tingling in the hands.  Patient's last tetanus injection was 2 years ago HPI  Past Medical History:  Diagnosis Date  . Diabetes mellitus without complication [facility])     Patient Active Problem List   Diagnosis Date Noted  . Insulin pump in place 10/04/2017  . Uncontrolled type 1 diabetes mellitus without complication 08/02/2015  . Diabetic retinopathy (HCC) 12/04/2013  . Refugee health examination 12/04/2013    Past Surgical History:  Procedure Laterality Date  . TONSILLECTOMY  2001       Home Medications    Prior to Admission medications   Medication Sig Start Date End Date Taking? Authorizing Provider  cephALEXin (KEFLEX) 500 MG capsule Take 1 capsule (500 mg total) by mouth 3 (three) times daily for 5 days. 02/15/20 02/20/20  Merrilee Jansky, MD  glucose blood (CONTOUR NEXT TEST) test strip USE TO TEST BLOOD SUGAR LEVELS FIVE TIMES DAILY AS DIRECTED 07/01/17   Carlus Pavlov, MD  ibuprofen (ADVIL) 600 MG tablet Take 1 tablet (600 mg total) by mouth every 6 (six) hours as needed. 02/15/20   Merrilee Jansky, MD  insulin aspart (NOVOLOG) 100 UNIT/ML injection ADMINISTER 60 UNITS VIA INSULIN PUMP DAILY 10/04/17   Myles Lipps, MD  Insulin Human (INSULIN PUMP) SOLN Inject into the skin.    [provider]  Multiple Vitamin (MULTIVITAMIN) tablet Take 1 tablet by mouth daily.    [provider]   VITAMIN D, ERGOCALCIFEROL, PO Take 2 tablets by mouth daily.    [provider]  cetirizine (ZYRTEC) 10 MG tablet Take 1 tablet (10 mg total) by mouth daily. Patient not taking: Reported on 10/04/2017 04/14/17 02/15/20  Doristine Bosworth, MD  fluticasone (FLONASE) 50 MCG/ACT nasal spray Place 2 sprays into both nostrils daily. Patient not taking: Reported on 10/04/2017 04/14/17 02/15/20  Doristine Bosworth, MD  glucagon (GLUCAGON EMERGENCY) 1 MG injection Inject 1 mg into the muscle once as needed. Patient not taking: Reported on 10/04/2017 09/23/16 02/15/20  Carlus Pavlov, MD    Family History Family History  Problem Relation Age of Onset  . Diabetes Sister     Social History Social History   Tobacco Use  . Smoking status: Never Smoker  . Smokeless tobacco: Never Used  Substance Use Topics  . Alcohol use: No  . Drug use: No     Allergies   Patient has no known allergies.   Review of Systems Review of Systems  Constitutional: Negative.   Gastrointestinal: Negative.   Musculoskeletal: Positive for myalgias.  Skin: Positive for wound. Negative for color change and pallor.     Physical Exam Triage Vital Signs ED Triage Vitals  Enc Vitals Group     BP 02/15/20 1216 (!) 134/101     Pulse Rate 02/15/20 1216 71     Resp 02/15/20 1216 16  Temp 02/15/20 1216 (!) 97.4 F (36.3 C)     Temp src --      SpO2 02/15/20 1216 99 %     Weight --      Height --      Head Circumference --      Peak Flow --      Pain Score 02/15/20 1215 0     Pain Loc --      Pain Edu? --      Excl. in GC? --    No data found.  Updated Vital Signs BP (!) 134/101   Pulse 71   Temp (!) 97.4 F (36.3 C)   Resp 16   SpO2 99%   Visual Acuity Right Eye Distance:   Left Eye Distance:   Bilateral Distance:    Right Eye Near:   Left Eye Near:    Bilateral Near:     Physical Exam Pulmonary:     Effort: Pulmonary effort is normal.     Breath sounds: Normal breath sounds.   Abdominal:     General: Bowel sounds are normal.     Palpations: Abdomen is soft.  Skin:    Capillary Refill: Capillary refill takes less than 2 seconds.     Comments: 3 mm puncture wound in the anterior aspect of the left upper arm.  Bleeding is controlled.  Neurological:     General: No focal deficit present.     Mental Status: He is oriented to person, place, and time.      UC Treatments / Results  Labs (all labs ordered are listed, but only abnormal results are displayed) Labs Reviewed - No data to display  EKG   Radiology No results found.  Procedures Foreign Body Removal  Date/Time: 02/18/2020 1:51 PM Performed by: Merrilee Jansky, MD Authorized by: Merrilee Jansky, MD   Consent:    Consent obtained:  Verbal   Consent given by:  Patient   Risks discussed:  Bleeding and incomplete removal   Alternatives discussed:  No treatment Location:    Location:  Arm   Arm location:  L upper arm   Depth:  Subcutaneous   Tendon involvement:  None Pre-procedure details:    Imaging:  None Procedure type:    Procedure complexity:  Simple Procedure details:    Localization method:  Probed   Dissection of underlying tissues: no     Bloodless field: no     Removal mechanism:  Forceps and hemostat   Foreign bodies recovered:  None   Intact foreign body removal: no   Post-procedure details:    Neurovascular status: intact     Confirmation:  Residual foreign bodies remain   Skin closure:  None   Patient tolerance of procedure:  Procedure terminated at patient's request   (including critical care time)  Medications Ordered in UC Medications - No data to display  Initial Impression / Assessment and Plan / UC Course  I have reviewed the triage vital signs and the nursing notes.  Pertinent labs & imaging results that were available during my care of the patient were reviewed by me and considered in my medical decision making (see chart for details).     1.   Puncture wound with retained foreign body: Attempt to remove retained foreign body was unsuccessful Procedure was terminated at patient's request X-ray of the left shoulder the foreign body in the subcutaneous region Keflex 500 mg 3 times daily for 5 days given as prophylaxis for  puncture wound Return precautions given  Final Clinical Impressions(s) / UC Diagnoses   Final diagnoses:  Puncture wound of left upper arm, initial encounter  Foreign body of skin of upper arm, initial encounter   Discharge Instructions   None    ED Prescriptions    Medication Sig Dispense Auth. Provider   cephALEXin (KEFLEX) 500 MG capsule Take 1 capsule (500 mg total) by mouth 3 (three) times daily for 5 days. 15 capsule Dannica Bickham, Britta Mccreedy, MD   ibuprofen (ADVIL) 600 MG tablet Take 1 tablet (600 mg total) by mouth every 6 (six) hours as needed. 30 tablet Micha Erck, Britta Mccreedy, MD     PDMP not reviewed this encounter.   Merrilee Jansky, MD 02/18/20 [phone]

## 2021-01-23 DIAGNOSIS — L209 Atopic dermatitis, unspecified: Secondary | ICD-10-CM | POA: Insufficient documentation

## 2021-01-23 DIAGNOSIS — J302 Other seasonal allergic rhinitis: Secondary | ICD-10-CM | POA: Insufficient documentation

## 2021-01-23 DIAGNOSIS — E559 Vitamin D deficiency, unspecified: Secondary | ICD-10-CM | POA: Insufficient documentation

## 2021-04-28 IMAGING — DX DG HUMERUS 2V *L*
2 series · 2 of 2 positions shown · non-contrast
Comparison: None.

CLINICAL DATA: Puncture wound to the left upper arm. Evaluate for
foreign body.

EXAM:
LEFT HUMERUS - 2+ VIEW

[humerus ap]
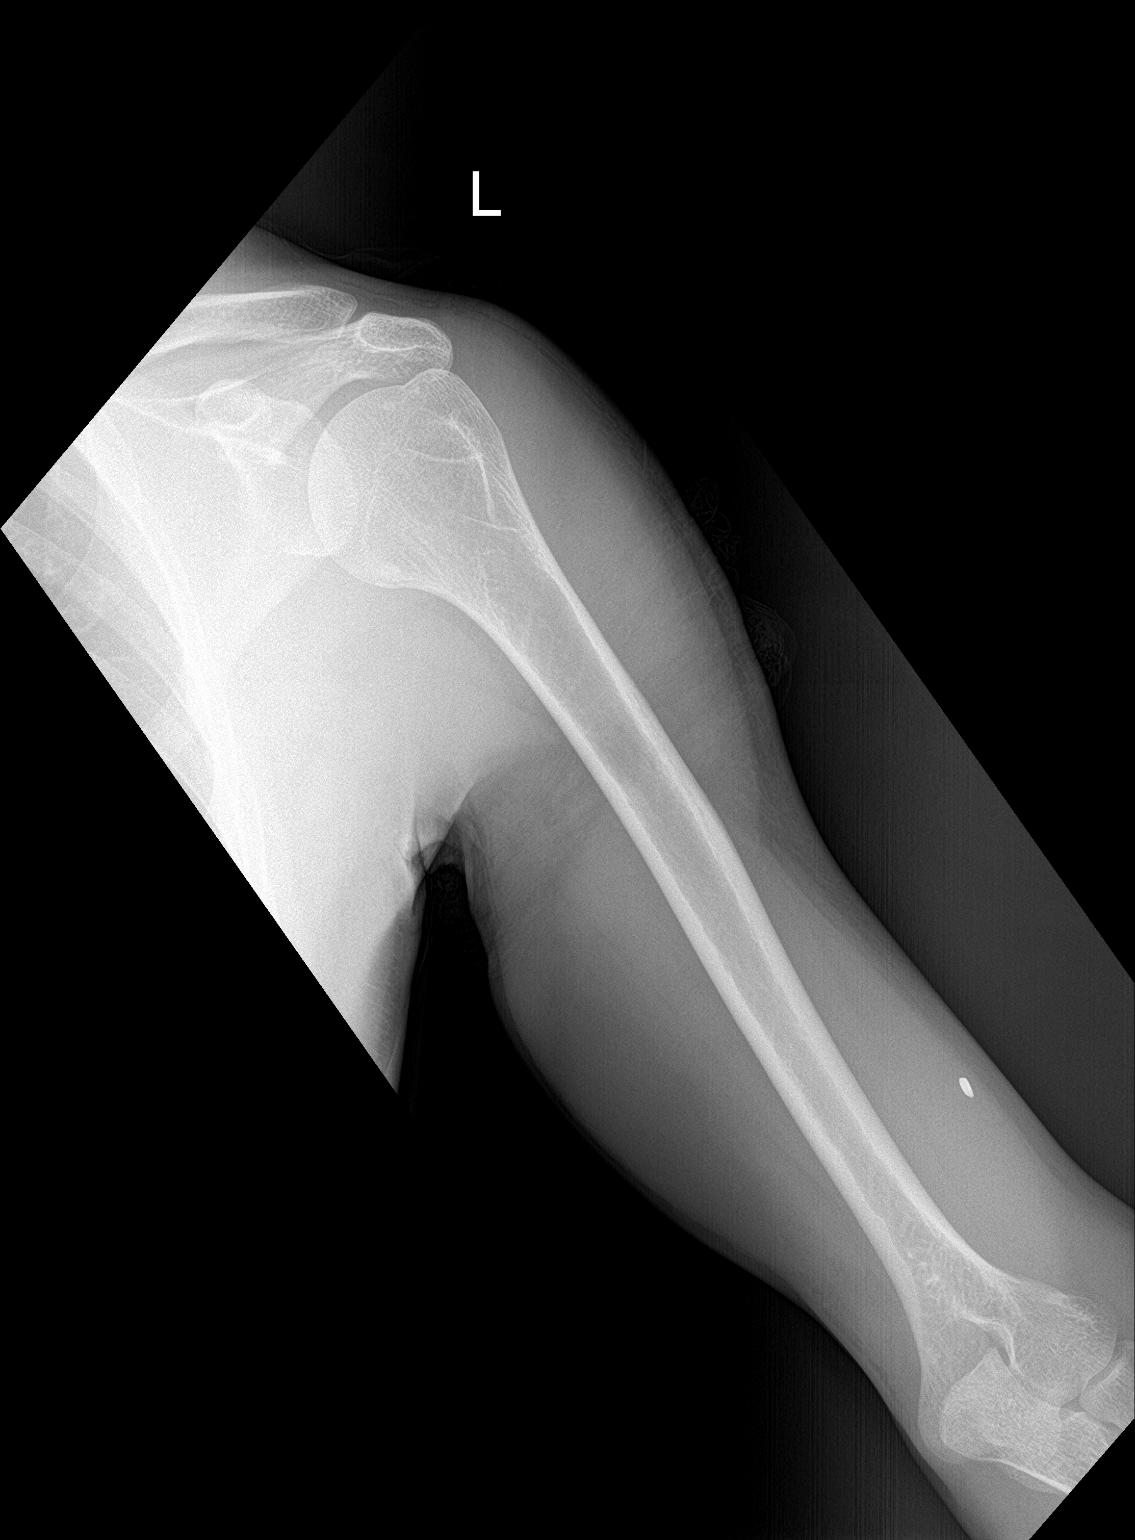

[humerus lat]
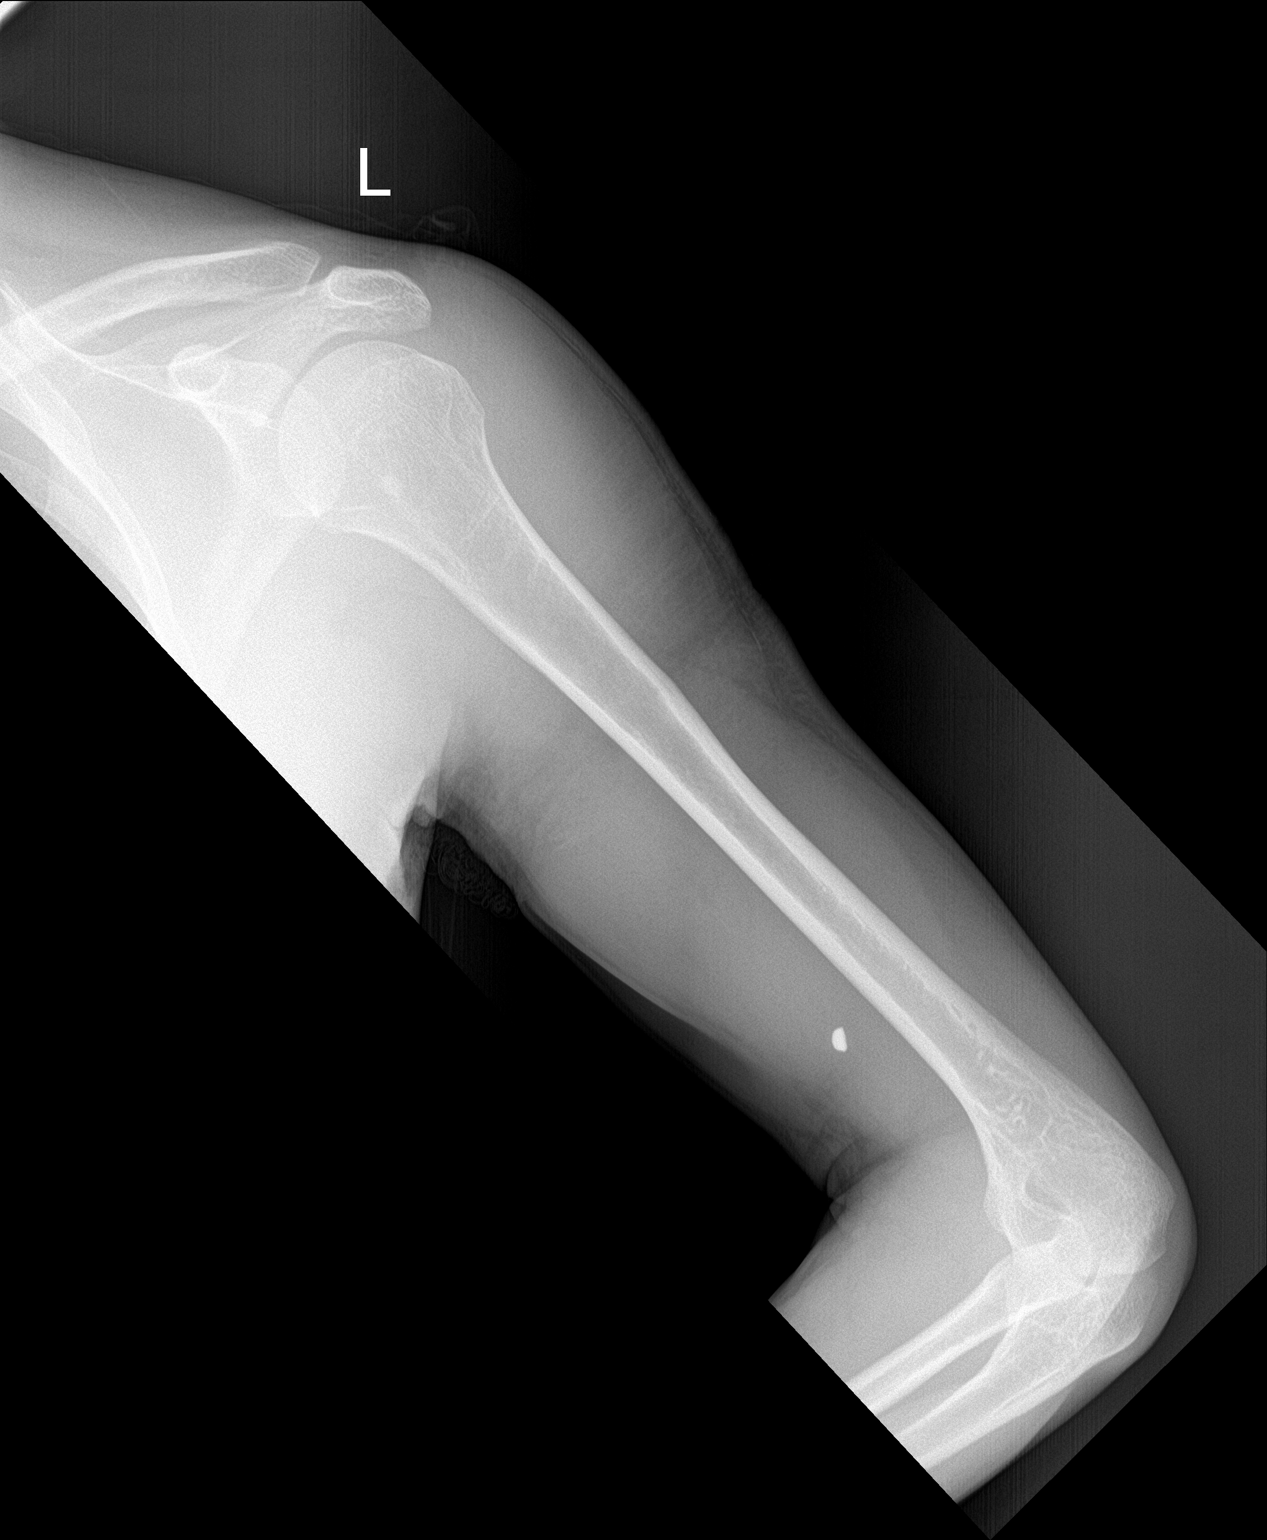

[2 of 2 positions shown; findings below may reference images not displayed]

FINDINGS: Two views of left humerus were obtained. Negative for a fracture. 6
mm metallic density in the soft tissues of the lower humeral region.
This metallic foreign body is probably within the anterolateral
aspect of the soft tissues. No gross abnormality to the left
shoulder or elbow.
IMPRESSION: Small metallic foreign body in the left upper arm soft tissues.

## 2021-05-21 LAB — COMPREHENSIVE METABOLIC PANEL
Albumin: 4.1 (ref 3.5–5.0)
Calcium: 9.6 (ref 8.7–10.7)

## 2021-05-21 LAB — HEPATIC FUNCTION PANEL
ALT: 12 U/L (ref 10–40)
AST: 15 (ref 14–40)
Alkaline Phosphatase: 56 (ref 25–125)
Bilirubin, Total: 0.5

## 2021-05-21 LAB — BASIC METABOLIC PANEL
BUN: 10 (ref 4–21)
CO2: 28 — AB (ref 13–22)
Chloride: 102 (ref 99–108)
Creatinine: 0.8 (ref 0.6–1.3)
Glucose: 141
Potassium: 4.2 mEq/L (ref 3.5–5.1)
Sodium: 141 (ref 137–147)

## 2021-05-21 LAB — HEMOGLOBIN A1C: Hemoglobin A1C: 6.3

## 2021-11-14 ENCOUNTER — Encounter (HOSPITAL_BASED_OUTPATIENT_CLINIC_OR_DEPARTMENT_OTHER): Payer: Self-pay

## 2021-11-14 ENCOUNTER — Emergency Department (HOSPITAL_BASED_OUTPATIENT_CLINIC_OR_DEPARTMENT_OTHER): Payer: 59

## 2021-11-14 ENCOUNTER — Other Ambulatory Visit: Payer: Self-pay

## 2021-11-14 ENCOUNTER — Emergency Department (HOSPITAL_BASED_OUTPATIENT_CLINIC_OR_DEPARTMENT_OTHER)
Admission: EM | Admit: 2021-11-14 | Discharge: 2021-11-14 | Disposition: A | Discharge status: Home / Self Care | Payer: 59 | Attending: Emergency Medicine | Admitting: Emergency Medicine

## 2021-11-14 DIAGNOSIS — R0602 Shortness of breath: Secondary | ICD-10-CM | POA: Diagnosis not present

## 2021-11-14 DIAGNOSIS — Z794 Long term (current) use of insulin: Secondary | ICD-10-CM | POA: Diagnosis not present

## 2021-11-14 DIAGNOSIS — R0789 Other chest pain: Secondary | ICD-10-CM | POA: Diagnosis present

## 2021-11-14 DIAGNOSIS — E1165 Type 2 diabetes mellitus with hyperglycemia: Secondary | ICD-10-CM | POA: Insufficient documentation

## 2021-11-14 LAB — CBC
HCT: 43.5 % (ref 39.0–52.0)
Hemoglobin: 15.4 g/dL (ref 13.0–17.0)
MCH: 29.3 pg (ref 26.0–34.0)
MCHC: 35.4 g/dL (ref 30.0–36.0)
MCV: 82.9 fL (ref 80.0–100.0)
Platelets: 265 10*3/uL (ref 150–400)
RBC: 5.25 MIL/uL (ref 4.22–5.81)
RDW: 12.4 % (ref 11.5–15.5)
WBC: 6.6 10*3/uL (ref 4.0–10.5)
nRBC: 0 % (ref 0.0–0.2)

## 2021-11-14 LAB — TROPONIN I (HIGH SENSITIVITY): Troponin I (High Sensitivity): <2 ng/L (ref <18)

## 2021-11-14 LAB — COMPREHENSIVE METABOLIC PANEL
ALT: 16 U/L (ref 0–44)
AST: 18 U/L (ref 15–41)
Albumin: 4.1 g/dL (ref 3.5–5.0)
Alkaline Phosphatase: 58 U/L (ref 38–126)
Anion gap: 7 (ref 5–15)
BUN: 10 mg/dL (ref 6–20)
CO2: 29 mmol/L (ref 22–32)
Calcium: 9 mg/dL (ref 8.9–10.3)
Chloride: 100 mmol/L (ref 98–111)
Creatinine, Ser: 0.84 mg/dL (ref 0.61–1.24)
GFR, Estimated: >60 mL/min (ref >60)
Glucose, Bld: 146 mg/dL — ABNORMAL HIGH (ref 70–99)
Potassium: 3.7 mmol/L (ref 3.5–5.1)
Sodium: 136 mmol/L (ref 135–145)
Total Bilirubin: 0.5 mg/dL (ref 0.3–1.2)
Total Protein: 7.4 g/dL (ref 6.5–8.1)

## 2021-11-14 NOTE — Discharge Instructions (Signed)
Please read and follow all provided instructions. ? ?Your diagnoses today include:  ?1. Chest tightness   ?2. Shortness of breath   ? ? ?Tests performed today include: ?An EKG of your heart: Did not show any signs of stress on the heart ?A chest x-ray: Was clear ?Cardiac enzymes: a blood test for heart muscle damage, was negative ?Blood counts and electrolytes: Blood sugar was slightly high but not problematic ?Vital signs. See below for your results today.  ? ?Medications prescribed:  ?None ? ?Take any prescribed medications only as directed. ? ?Follow-up instructions: ?Please follow-up with your primary care provider as soon as you can for further evaluation of your symptoms.  ? ?Return instructions:  ?SEEK IMMEDIATE MEDICAL ATTENTION IF: ?You have severe chest pain, especially if the pain is crushing or pressure-like and spreads to the arms, back, neck, or jaw, or if you have sweating, nausea or vomiting, or trouble with breathing. THIS IS AN EMERGENCY. Do not wait to see if the pain will go away. Get medical help at once. Call 911. DO NOT drive yourself to the hospital.  ?Your chest pain gets worse and does not go away after a few minutes of rest.  ?You have an attack of chest pain lasting longer than what you usually experience.  ?You have significant dizziness, if you pass out, or have trouble walking.  ?You have chest pain not typical of your usual pain for which you originally saw your caregiver.  ?You have any other emergent concerns regarding your health. ? ?Additional Information: ?Chest pain comes from many different causes. Your caregiver has diagnosed you as having chest pain that is not specific for one problem, but does not require admission.  You are at low risk for an acute heart condition or other serious illness.  ? ?Your vital signs today were: ?BP 128/90   Pulse 64   Temp 97.8 ?F (36.6 ?C) (Oral)   Resp 15   Ht 5\' 8"  (1.727 m)   Wt 63.5 kg   SpO2 97%   BMI 21.29 kg/m?  ?If your blood  pressure (BP) was elevated above 135/85 this visit, please have this repeated by your doctor within one month. ?-------------- ? ? ?

## 2021-11-14 NOTE — ED Notes (Signed)
Pt A&OX4 ambulatory at d/c with independent steady gait. Pt verbalized understanding of d/c instructions and follow up care. 

## 2021-11-14 NOTE — ED Provider Notes (Signed)
?[facility] EMERGENCY DEPARTMENT ?Provider Note ? ? ?CSN: [id number] ?Arrival date & time: 11/14/21  1659 ? ?  ? ?History ? ?Chief Complaint  ?Patient presents with  ? Chest Pain  ? ? ?Barry Potter is a 36 y.o. male. ? ?Patient with history of insulin-dependent diabetes presents to the emergency department for evaluation of chest tightness and shortness of breath.  Symptoms started towards the end of January.  Patient reports a generalized tightness in his chest and shortness of breath that occurs when he goes to work.  He states that he has a high stress job.  He states that symptoms improve when he is home and is able to relax, such as in the morning.  He denies cough.  Prior to symptoms beginning, he did have a 14-hour plane ride, however he denies swelling or pain in his lower extremities.  No history of cardiac disease or blood clots.  No history of hypertension, high cholesterol, tobacco use.  Currently only medical provider is endocrinology.  Symptoms are occurring nearly daily.  Patient states that it is interfering with his daily life and home life.  Symptoms are unaffected by food and nonexertional in nature. ? ? ?  ? ?Home Medications ?Prior to Admission medications   ?Medication Sig Start Date End Date Taking? Authorizing Provider  ?glucose blood (CONTOUR NEXT TEST) test strip USE TO TEST BLOOD SUGAR LEVELS FIVE TIMES DAILY AS DIRECTED 07/01/17   Philemon Kingdom, MD  ?ibuprofen (ADVIL) 600 MG tablet Take 1 tablet (600 mg total) by mouth every 6 (six) hours as needed. 02/15/20   Chase Picket, MD  ?insulin aspart (NOVOLOG) 100 UNIT/ML injection ADMINISTER 60 UNITS VIA INSULIN PUMP DAILY 10/04/17   Jacelyn Pi, Lilia Argue, MD  ?Insulin Human (INSULIN PUMP) SOLN Inject into the skin.    [provider]  ?Multiple Vitamin (MULTIVITAMIN) tablet Take 1 tablet by mouth daily.    [provider]  ?VITAMIN D, ERGOCALCIFEROL, PO Take 2 tablets by mouth daily.    [provider]   ?cetirizine (ZYRTEC) 10 MG tablet Take 1 tablet (10 mg total) by mouth daily. ?Patient not taking: Reported on 10/04/2017 04/14/17 02/15/20  Forrest Moron, MD  ?fluticasone (FLONASE) 50 MCG/ACT nasal spray Place 2 sprays into both nostrils daily. ?Patient not taking: Reported on 10/04/2017 04/14/17 02/15/20  Forrest Moron, MD  ?glucagon (GLUCAGON EMERGENCY) 1 MG injection Inject 1 mg into the muscle once as needed. ?Patient not taking: Reported on 10/04/2017 09/23/16 02/15/20  Philemon Kingdom, MD  ?   ? ?Allergies    ?Patient has no known allergies.   ? ?Review of Systems   ?Review of Systems ? ?Physical Exam ?Updated Vital Signs ?BP (!) 155/95   Pulse 72   Temp 97.8 ?F (36.6 ?C) (Oral)   Resp 16   Ht 5\' 8"  (1.727 m)   Wt 63.5 kg   SpO2 100%   BMI 21.29 kg/m?  ? ?Physical Exam ?Vitals and nursing note reviewed.  ?Constitutional:   ?   Appearance: He is well-developed. He is not diaphoretic.  ?HENT:  ?   Head: Normocephalic and atraumatic.  ?   Mouth/Throat:  ?   Mouth: Mucous membranes are not dry.  ?Eyes:  ?   Conjunctiva/sclera: Conjunctivae normal.  ?Neck:  ?   Vascular: Normal carotid pulses. No carotid bruit or JVD.  ?   Trachea: Trachea normal. No tracheal deviation.  ?Cardiovascular:  ?   Rate and Rhythm: Normal rate and  regular rhythm.  ?   Pulses: No decreased pulses.     ?     Radial pulses are 2+ on the right side and 2+ on the left side.  ?   Heart sounds: Normal heart sounds, S1 normal and S2 normal. Heart sounds not distant. No murmur heard. ?Pulmonary:  ?   Effort: Pulmonary effort is normal. No respiratory distress.  ?   Breath sounds: Normal breath sounds. No wheezing.  ?Chest:  ?   Chest wall: No tenderness.  ?Abdominal:  ?   General: Bowel sounds are normal.  ?   Palpations: Abdomen is soft.  ?   Tenderness: There is no abdominal tenderness. There is no guarding or rebound.  ?Musculoskeletal:  ?   Cervical back: Normal range of motion and neck supple. No muscular tenderness.  ?   Right lower  leg: No tenderness. No edema.  ?   Left lower leg: No tenderness. No edema.  ?Skin: ?   General: Skin is warm and dry.  ?   Coloration: Skin is not pale.  ?Neurological:  ?   Mental Status: He is alert. Mental status is at baseline.  ?Psychiatric:     ?   Mood and Affect: Mood normal.  ? ? ?ED Results / Procedures / Treatments   ?Labs ?(all labs ordered are listed, but only abnormal results are displayed) ?Labs Reviewed  ?COMPREHENSIVE METABOLIC PANEL - Abnormal; Notable for the following components:  ?    Result Value  ? Glucose, Bld 146 (*)   ? All other components within normal limits  ?CBC  ?TROPONIN I (HIGH SENSITIVITY)  ? ? ?EKG ?EKG Interpretation ? ?Date/Time:  Friday November 14 2021 17:10:46 EDT ?Ventricular Rate:  71 ?PR Interval:  142 ?QRS Duration: 87 ?QT Interval:  380 ?QTC Calculation: 413 ?R Axis:   79 ?Text Interpretation: Sinus rhythm Consider left atrial enlargement ST elev, probable normal early repol pattern Confirmed by Campbell Stall ([id number]) on [id number] 5:43:59 PM ? ?Radiology ?DG Chest 2 View ? ?Result Date: 11/14/2021 ?CLINICAL DATA:  Chest tightness. EXAM: CHEST - 2 VIEW COMPARISON:  05/20/2015 FINDINGS: The cardiomediastinal contours are normal. The lungs are clear. Pulmonary vasculature is normal. No consolidation, pleural effusion, or pneumothorax. No acute osseous abnormalities are seen. IMPRESSION: Negative radiographs of the chest. Electronically Signed   By: Keith Rake M.D.   On: 11/14/2021 17:37   ? ?Procedures ?Procedures  ? ? ?Medications Ordered in ED ?Medications - No data to display ? ?ED Course/ Medical Decision Making/ A&P ?  ? ?Patient seen and examined. History obtained directly from patient and family member at bedside.  ? ?Labs/EKG: Ordered CBC, CMP, troponin.  EKG. ? ?Imaging: Ordered chest x-ray. ? ?Medications/Fluids: None ordered. ? ?Most recent vital signs reviewed and are as follows: ?BP (!) 155/95   Pulse 72   Temp 97.8 ?F (36.6 ?C) (Oral)   Resp 16   Ht 5\' 8"   (1.727 m)   Wt 63.5 kg   SpO2 100%   BMI 21.29 kg/m?  ? ?Initial impression: Nonspecific chest tightness and shortness of breath.  Possibly stress related given that it occurs when he is under stress at work. ? ?6:18 PM Reassessment performed. Patient appears anxious but comfortable. ? ?Labs personally reviewed and interpreted including: CBC unremarkable, CMP mild hyperglycemia otherwise unremarkable; troponin negative. ? ?Imaging personally visualized and interpreted including: Chest x-ray without problems ? ?Reviewed pertinent lab work and imaging with patient at bedside. Questions answered.  ? ?Most  current vital signs reviewed and are as follows: ?BP 128/90   Pulse 64   Temp 97.8 ?F (36.6 ?C) (Oral)   Resp 15   Ht 5\' 8"  (1.727 m)   Wt 63.5 kg   SpO2 97%   BMI 21.29 kg/m?  ? ?Plan: Discharge to home.  ? ?Prescriptions written for: None ? ?Other home care instructions discussed: Avoidance of triggers, relax, patient is off over the weekend ? ?ED return instructions discussed: Return and follow-up instructions: I encouraged patient to return to ED with severe chest pain, especially if the pain is crushing or pressure-like and spreads to the arms, back, neck, or jaw, or if they have associated sweating, vomiting, or shortness of breath with the pain, or significant pain with activity. We discussed that the evaluation here today indicates a low-risk of serious cause of chest pain, including heart trouble or a blood clot, but no evaluation is perfect and chest pain can evolve with time. The patient verbalized understanding and agreed.  I encouraged patient to follow-up with their provider in the next 48 hours for recheck.   ? ?Follow-up instructions discussed: Patient encouraged to follow-up with their PCP in 7 days.  ? ? ? ?                        ?Medical Decision Making ?Amount and/or Complexity of Data Reviewed ?Labs: ordered. ?Radiology: ordered. ? ? ?For this patient's complaint of chest  pain/tightness, the following emergent conditions were considered on the differential diagnosis: acute coronary syndrome, pulmonary embolism, pneumothorax, myocarditis, pericardial tamponade, aortic dissection, thoracic aortic aneurysm

## 2021-11-14 NOTE — ED Triage Notes (Signed)
Patient complains of chest tightness x 4 months. No new changes today. States wanted to get it checked out. Shortness of breath is intermittent.  ?

## 2021-12-01 ENCOUNTER — Ambulatory Visit (INDEPENDENT_AMBULATORY_CARE_PROVIDER_SITE_OTHER): Payer: 59 | Admitting: Family Medicine

## 2021-12-01 ENCOUNTER — Encounter: Payer: Self-pay | Admitting: Family Medicine

## 2021-12-01 VITALS — BP 124/70 | HR 65 | Temp 97.0°F | Ht 67.0 in | Wt 151.4 lb

## 2021-12-01 DIAGNOSIS — E1065 Type 1 diabetes mellitus with hyperglycemia: Secondary | ICD-10-CM | POA: Diagnosis not present

## 2021-12-01 DIAGNOSIS — F411 Generalized anxiety disorder: Secondary | ICD-10-CM | POA: Diagnosis not present

## 2021-12-01 DIAGNOSIS — Z8639 Personal history of other endocrine, nutritional and metabolic disease: Secondary | ICD-10-CM | POA: Insufficient documentation

## 2021-12-01 DIAGNOSIS — E109 Type 1 diabetes mellitus without complications: Secondary | ICD-10-CM

## 2021-12-01 DIAGNOSIS — Z9641 Presence of insulin pump (external) (internal): Secondary | ICD-10-CM | POA: Insufficient documentation

## 2021-12-01 DIAGNOSIS — R079 Chest pain, unspecified: Secondary | ICD-10-CM

## 2021-12-01 MED ORDER — ESCITALOPRAM OXALATE 10 MG PO TABS: 10.0000 mg | ORAL_TABLET | Freq: Every day | ORAL | 2 refills | Status: DC

## 2021-12-01 NOTE — Assessment & Plan Note (Signed)
Patient with chest pain ?Thorough work-up in ED was negative as described above ?Elevated GAD-7 at 15 ?Likely related to anxiety, however did discuss other causes of chest pain shortness of breath including cardiovascular/heart failure, asthma, GERD ?Given elevated GAD-7, will try SSRI/escitalopram 10 mg daily ?Have patient follow-up in 2 months ?Return precautions discussed ?

## 2021-12-01 NOTE — Progress Notes (Signed)
? ?New Patient Office Visit ? ?Subjective   ? ?Patient ID: Barry Potter, male    DOB: 01-May-1986  Age: 36 y.o. MRN: [id number] ? ?CC:  ?Chief Complaint  ?Patient presents with  ? Establish Care  ?  Np est care No concerns   ? ? ?HPI ?Barry Potter presents to establish care.  ? ?Patient speaks similar.  But he is accompanied with his wife, who also helps interpret.  Personal services were planned. ? ?Patient states that he feels chest pain intermittently.patient went to the ED on 10/2021, at that time patient had normal chest x-ray, borderline ST changes on EKG, however considered early ST polarization, CMP showed some mild hyperglycemia, however troponin was negative, CBC was negative.  The chest pain is location: substernal, describes sharp and tight, it is also rightsided, reports that the tightness is worse, started 6 months ago, worsening over the past month, frequency: every 2-3 days, not getting more frequent, each episode lasts 24 hours, each episode is not getting loner, pain level: 3/10, pain level constant,  ?It is worse with anxiety and stress at work, denies palpitations, endorses shortness of breath, ?Patient denies any headache, blurred vision, lower extremity swelling. ? ?No family history of heart disease ? ?Outpatient Encounter Medications as of 12/01/2021  ?Medication Sig  ? escitalopram (LEXAPRO) 10 MG tablet Take 1 tablet (10 mg total) by mouth daily. Take 1/2 tablet in the morning for 2 weeks, then increase to 1 full tablet  ? Glucagon, rDNA, (GLUCAGON EMERGENCY) 1 MG KIT See admin instructions.  ? glucose blood (CONTOUR NEXT TEST) test strip USE TO TEST BLOOD SUGAR LEVELS FIVE TIMES DAILY AS DIRECTED  ? insulin aspart (NOVOLOG) 100 UNIT/ML injection ADMINISTER 60 UNITS VIA INSULIN PUMP DAILY  ? Insulin Human (INSULIN PUMP) SOLN Inject into the skin.  ? Multiple Vitamin (MULTIVITAMIN) tablet Take 1 tablet by mouth daily.  ? VITAMIN D, ERGOCALCIFEROL, PO Take 2 tablets by mouth daily.  ? clobetasol  ointment (TEMOVATE) 0.05 % clobetasol 0.05 % topical ointment ? APPLY A SMALL AMOUNT TO AREA ON CHIN TWICE DAILY FOR 4 WEEKS  ? [DISCONTINUED] cetirizine (ZYRTEC) 10 MG tablet Take 1 tablet (10 mg total) by mouth daily. (Patient not taking: Reported on 10/04/2017)  ? [DISCONTINUED] fluticasone (FLONASE) 50 MCG/ACT nasal spray Place 2 sprays into both nostrils daily. (Patient not taking: Reported on 10/04/2017)  ? [DISCONTINUED] glucagon (GLUCAGON EMERGENCY) 1 MG injection Inject 1 mg into the muscle once as needed. (Patient not taking: Reported on 10/04/2017)  ? [DISCONTINUED] ibuprofen (ADVIL) 600 MG tablet Take 1 tablet (600 mg total) by mouth every 6 (six) hours as needed.  ? ?No facility-administered encounter medications on file as of 12/01/2021.  ? ? ?Past Medical History:  ?Diagnosis Date  ? Diabetes mellitus without complication ([place])   ? ? ?Past Surgical History:  ?Procedure Laterality Date  ? TONSILLECTOMY  2001  ? ? ?Family History  ?Problem Relation Age of Onset  ? Diabetes Sister   ? ? ?Social History  ? ?Socioeconomic History  ? Marital status: Married  ?  Spouse name: Not on file  ? Number of children: Not on file  ? Years of education: Not on file  ? Highest education level: Not on file  ?Occupational History  ? Occupation: Dealer  ?Tobacco Use  ? Smoking status: Never  ? Smokeless tobacco: Never  ?Vaping Use  ? Vaping Use: Never used  ?Substance and Sexual Activity  ? Alcohol use: No  ? Drug  use: No  ? Sexual activity: Yes  ?Other Topics Concern  ? Not on file  ?Social History Narrative  ? Arrived in [place]:  Date: early April 2015  ? Home country: [place] for leaving: fled [place] as family worked for [place] companyLanguage:  ArabicDoes Require intepreter  Education:  8th grade; worked in receptionPreventative Care History:  Vaccinations: unknown  ? ?Social Determinants of Health  ? ?Financial Resource Strain: Not on file  ?Food Insecurity: Not on file  ?Transportation Needs: Not on file  ?Physical Activity:  Not on file  ?Stress: Not on file  ?Social Connections: Not on file  ?Intimate Partner Violence: Not on file  ? ? ?ROS ?As per HPI ?  ? ? ?Objective   ? ?BP 124/70 (BP Location: Right Arm, Patient Position: Sitting, Cuff Size: Normal)   Pulse 65   Temp (!) 97 ?F (36.1 ?C) (Temporal)   Ht '5\' 7"'  (1.702 m)   Wt 151 lb 6.4 oz (68.7 kg)   SpO2 98%   BMI 23.71 kg/m?  ? ?Gen: NAD, resting comfortably ?CV: RRR with no murmurs appreciated ?Pulm: NWOB, CTAB with no crackles, wheezes, or rhonchi ?GI: Normal bowel sounds present. Soft, Nontender, Nondistended. ?MSK: no edema, cyanosis, or clubbing noted ?Skin: warm, dry ?Neuro: grossly normal, moves all extremities ?Psych: Normal affect and thought content ? ? ?Last CBC ?Lab Results  ?Component Value Date  ? WBC 6.6 11/14/2021  ? HGB 15.4 11/14/2021  ? HCT 43.5 11/14/2021  ? MCV 82.9 11/14/2021  ? MCH 29.3 11/14/2021  ? RDW 12.4 11/14/2021  ? PLT 265 11/14/2021  ? ?Last metabolic panel ?Lab Results  ?Component Value Date  ? GLUCOSE 146 (H) 11/14/2021  ? NA 136 11/14/2021  ? K 3.7 11/14/2021  ? CL 100 11/14/2021  ? CO2 29 11/14/2021  ? BUN 10 11/14/2021  ? CREATININE 0.84 11/14/2021  ? GFRNONAA >60 11/14/2021  ? CALCIUM 9.0 11/14/2021  ? PROT 7.4 11/14/2021  ? ALBUMIN 4.1 11/14/2021  ? BILITOT 0.5 11/14/2021  ? ALKPHOS 58 11/14/2021  ? AST 18 11/14/2021  ? ALT 16 11/14/2021  ? ANIONGAP 7 11/14/2021  ? ?Last lipids ?Lab Results  ?Component Value Date  ? CHOL 125 09/23/2016  ? HDL 54.00 09/23/2016  ? [place] 42 09/23/2016  ? TRIG 143.0 09/23/2016  ? CHOLHDL 2 09/23/2016  ? ?Last hemoglobin A1c ?Lab Results  ?Component Value Date  ? HGBA1C 6.5 07/01/2017  ? ?Last thyroid functions ?Lab Results  ?Component Value Date  ? TSH 1.43 09/23/2016  ? ?Last vitamin D ?No results found for: 25OHVITD2, [place], VD25OH ?Last vitamin B12 and Folate ?Lab Results  ?Component Value Date  ? [id number] 468 05/15/2014  ? ?  ? ?Assessment & Plan:  ? ?Problem List Items Addressed This Visit    ? ?  ? Endocrine  ? Type 1 diabetes mellitus without complications (HCC)  ?  Patient follows with [facility] endocrinology ?No overt symptoms of polyuria polydipsia ?Continue to follow. ?Denies need for any refills. ? ?  ?  ? Relevant Medications  ? Glucagon, rDNA, (GLUCAGON EMERGENCY) 1 MG KIT  ?  ? Other  ? GAD (generalized anxiety disorder) - Primary  ?  Patient with chest pain ?Thorough work-up in ED was negative as described above ?Elevated GAD-7 at 15 ?Likely related to anxiety, however did discuss other causes of chest pain shortness of breath including cardiovascular/heart failure, asthma, GERD ?Given elevated GAD-7, will try SSRI/escitalopram 10 mg daily ?Have patient follow-up  in 2 months ?Return precautions discussed ? ?  ?  ? Relevant Medications  ? escitalopram (LEXAPRO) 10 MG tablet  ? Other Relevant Orders  ? Ambulatory referral to [facility]  ? ?Other Visit Diagnoses   ? ? Type 1 diabetes mellitus with hyperglycemia (HCC)   (Chronic)    ? Relevant Medications  ? Glucagon, rDNA, (GLUCAGON EMERGENCY) 1 MG KIT  ? Chest pain, unspecified type      ? ?  ? ? ?Return in about 2 months (around 01/31/2022).  ? ?Bonnita Hollow, MD ? ? ?

## 2021-12-01 NOTE — Assessment & Plan Note (Addendum)
Patient follows with [facility] endocrinology ?No overt symptoms of polyuria polydipsia ?Continue to follow. ?Denies need for any refills. ?

## 2021-12-01 NOTE — Patient Instructions (Signed)
Is a pleasure to meet you today. ?Start escitalopram as prescribed. ?We are referring to behavioral health. ?

## 2022-02-02 ENCOUNTER — Ambulatory Visit (INDEPENDENT_AMBULATORY_CARE_PROVIDER_SITE_OTHER): Payer: 59 | Admitting: Family Medicine

## 2022-02-02 ENCOUNTER — Encounter: Payer: Self-pay | Admitting: Family Medicine

## 2022-02-02 VITALS — BP 128/84 | HR 67 | Temp 97.5°F | Wt 156.2 lb

## 2022-02-02 DIAGNOSIS — F411 Generalized anxiety disorder: Secondary | ICD-10-CM

## 2022-02-02 DIAGNOSIS — R079 Chest pain, unspecified: Secondary | ICD-10-CM | POA: Insufficient documentation

## 2022-02-02 DIAGNOSIS — K219 Gastro-esophageal reflux disease without esophagitis: Secondary | ICD-10-CM

## 2022-02-02 DIAGNOSIS — F410 Panic disorder [episodic paroxysmal anxiety] without agoraphobia: Secondary | ICD-10-CM | POA: Diagnosis not present

## 2022-02-02 MED ORDER — SERTRALINE HCL 25 MG PO TABS: 12.5000 mg | ORAL_TABLET | Freq: Every day | ORAL | 0 refills | Status: DC

## 2022-02-02 MED ORDER — FAMOTIDINE 20 MG PO TABS: 20.0000 mg | ORAL_TABLET | Freq: Every day | ORAL | 11 refills | Status: AC | PRN

## 2022-02-02 NOTE — Patient Instructions (Addendum)
Stop escitalopram. Start sertraline 1/2 tablet daily. Take Pepcid as needed for heartburn. Referral to cardiology

## 2022-02-02 NOTE — Assessment & Plan Note (Addendum)
Tender at [facility] Likely worse due to ongoing GAD Trial Pepcid as needed

## 2022-02-02 NOTE — Progress Notes (Addendum)
Assessment/Plan:   Problem List Items Addressed This Visit       Digestive   Gastroesophageal reflux disease    Tender at epigastrium Likely worse due to ongoing GAD Trial Pepcid as needed      Relevant Medications   famotidine (PEPCID) 20 MG tablet     Other   GAD (generalized anxiety disorder) - Primary    Not improved, .  Associated with panic disorder Did not tolerate escitalopram DC escitalopram Trial low-dose Zoloft 12.5 mg daily Return precautions discussed      Relevant Medications   sertraline (ZOLOFT) 25 MG tablet   Chest pain    Atypical More likely GERD versus anxiety Recent ED work-up negative However given history of diabetes type 1, will refer to cardiology just to rule out any type of underlying cardiac disease      Relevant Orders   Ambulatory referral to Cardiology   Other Visit Diagnoses     Panic anxiety syndrome       Relevant Medications   sertraline (ZOLOFT) 25 MG tablet          Subjective:  HPI:  Barry Potter is a 36 y.o. male who has Diabetic retinopathy ([place]); Refugee health examination; Insulin pump in place; Atopic dermatitis; Perennial allergic rhinitis with seasonal variation; Vitamin D deficiency; Type 1 diabetes mellitus without complications ([place]); GAD (generalized anxiety disorder); Gastroesophageal reflux disease; and Chest pain on their problem list..   He  has a past medical history of Diabetes mellitus without complication ([place]).Marland Kitchen   He presents with chief complaint of Follow-up (2 Month follow up PHQ/GAD) .   Depression/Anxiety, established problem, not improved Current Medications: Escitalopram 10 mg, patient self discontinued Side Effects: Nausea, dizziness, "feeling funny, self discontinued Current Symptoms/Interim History:  Data panic attack x1, no having epigastric/chest pain that radiates to right arm     12/01/2021    8:09 AM  Depression screen PHQ 2/9  Decreased Interest 0  Down, Depressed, Hopeless 0   PHQ - 2 Score 0       12/01/2021    9:04 AM  GAD 7 : Generalized Anxiety Score  Nervous, Anxious, on Edge 3  Control/stop worrying 3  Worry too much - different things 3  Trouble relaxing 1  Restless 0  Easily annoyed or irritable 2  Afraid - awful might happen 3  Total GAD 7 Score 15    ROS: No SI or HI.   Past Surgical History:  Procedure Laterality Date   TONSILLECTOMY  2001    Outpatient Medications Prior to Visit  Medication Sig Dispense Refill   clobetasol ointment (TEMOVATE) 0.05 % clobetasol 0.05 % topical ointment  APPLY A SMALL AMOUNT TO AREA ON CHIN TWICE DAILY FOR 4 WEEKS     Glucagon, rDNA, (GLUCAGON EMERGENCY) 1 MG KIT See admin instructions.     glucose blood (CONTOUR NEXT TEST) test strip USE TO TEST BLOOD SUGAR LEVELS FIVE TIMES DAILY AS DIRECTED 150 each 11   insulin aspart (NOVOLOG) 100 UNIT/ML injection ADMINISTER 60 UNITS VIA INSULIN PUMP DAILY 90 mL 1   Insulin Human (INSULIN PUMP) SOLN Inject into the skin.     Multiple Vitamin (MULTIVITAMIN) tablet Take 1 tablet by mouth daily.     escitalopram (LEXAPRO) 10 MG tablet Take 1 tablet (10 mg total) by mouth daily. Take 1/2 tablet in the morning for 2 weeks, then increase to 1 full tablet 30 tablet 2   VITAMIN D, ERGOCALCIFEROL, PO Take 2 tablets by  mouth daily. (Patient not taking: Reported on 02/02/2022)     No facility-administered medications prior to visit.    Family History  Problem Relation Age of Onset   Diabetes Sister     Social History   Socioeconomic History   Marital status: Married    Spouse name: Not on file   Number of children: Not on file   Years of education: Not on file   Highest education level: Not on file  Occupational History   Occupation: Dealer  Tobacco Use   Smoking status: Never   Smokeless tobacco: Never  Vaping Use   Vaping Use: Never used  Substance and Sexual Activity   Alcohol use: No   Drug use: No   Sexual activity: Yes  Other Topics Concern   Not  on file  Social History Narrative   Arrived in [place]:  Date: early April 2015   [place] country[place]: fled [place] as family worked for [place] companyLanguage:  Therapist, art  Education:  8th grade; worked in receptionPreventative Care History:  Vaccinations: unknown   Social Determinants of Radio broadcast assistant Strain: Not on Art therapist Insecurity: Not on file  Transportation Needs: Not on file  Physical Activity: Not on file  Stress: Not on file  Social Connections: Not on file  Intimate Partner Violence: Not on file                                                                                                 Objective:  Physical Exam: BP 128/84 (BP Location: Left Arm, Patient Position: Sitting, Cuff Size: Large)   Pulse 67   Temp (!) 97.5 F (36.4 C) (Temporal)   Wt 156 lb 3.2 oz (70.9 kg)   SpO2 98%   BMI 24.46 kg/m    General: No acute distress. Awake and conversant.  Eyes: Normal conjunctiva, anicteric. Round symmetric pupils.  ENT: Hearing grossly intact. No nasal discharge.  Neck: Neck is supple. No masses or thyromegaly.  Respiratory: Respirations are non-labored. No auditory wheezing.  Skin: Warm. No rashes or ulcers.  Psych: Alert and oriented. Cooperative, Appropriate mood and affect, Normal judgment.  CV: No cyanosis or JVD ABD: Tender at epigastrium, nontender remainder abdomen, not distended, no rebound or guarding MSK: Normal ambulation. No clubbing  Neuro: Sensation and CN II-XII grossly normal.        Alesia Banda, MD, MS

## 2022-02-02 NOTE — Assessment & Plan Note (Addendum)
Not improved, .  Associated with panic disorder Did not tolerate escitalopram DC escitalopram Trial low-dose Zoloft 12.5 mg daily Return precautions discussed

## 2022-02-02 NOTE — Assessment & Plan Note (Signed)
Atypical More likely GERD versus anxiety Recent ED work-up negative However given history of diabetes type 1, will refer to cardiology just to rule out any type of underlying cardiac disease

## 2022-06-03 ENCOUNTER — Telehealth: Payer: Self-pay | Admitting: Family Medicine

## 2022-06-03 ENCOUNTER — Ambulatory Visit: Payer: Self-pay | Admitting: Family Medicine

## 2022-06-03 NOTE — Telephone Encounter (Signed)
Pt was a no show 11/15 for an OV with Dr. Janee Morn. His wife called in around 2 to say that they were unable to come in. This is his first, letter sent

## 2022-06-04 ENCOUNTER — Encounter: Payer: Self-pay | Admitting: Family Medicine

## 2022-06-04 ENCOUNTER — Ambulatory Visit (INDEPENDENT_AMBULATORY_CARE_PROVIDER_SITE_OTHER): Payer: Commercial Managed Care - HMO | Admitting: Family Medicine

## 2022-06-04 VITALS — BP 110/70 | HR 79 | Temp 97.8°F | Wt 153.4 lb

## 2022-06-04 DIAGNOSIS — J321 Chronic frontal sinusitis: Secondary | ICD-10-CM

## 2022-06-04 DIAGNOSIS — F411 Generalized anxiety disorder: Secondary | ICD-10-CM | POA: Diagnosis not present

## 2022-06-04 DIAGNOSIS — E109 Type 1 diabetes mellitus without complications: Secondary | ICD-10-CM | POA: Diagnosis not present

## 2022-06-04 MED ORDER — IPRATROPIUM BROMIDE 0.06 % NA SOLN: 2.0000 | Freq: Four times a day (QID) | NASAL | 12 refills | Status: AC

## 2022-06-04 NOTE — Progress Notes (Signed)
Assessment/Plan:   Problem List Items Addressed This Visit       Respiratory   Chronic frontal sinusitis - Primary    Chronic frontal sinusitis, status post Augmentin and prednisone and OTC medications Insetting of diabetes, concern for possible mucormycosis, although patient very well-appearing today cannot rule out other causes of chronic sinusitis including allergies, polyps, septal defects etc. Will get CT sinus and refer to ENT ED and return precautions discussed      Relevant Medications   ipratropium (ATROVENT) 0.06 % nasal spray   Other Relevant Orders   CT Maxillofacial WO CM   Ambulatory referral to ENT     Endocrine   Type 1 diabetes mellitus without complications ([place])   Relevant Orders   CT Maxillofacial WO CM   Ambulatory referral to ENT     Other   GAD (generalized anxiety disorder)    Stable, self discontinued medication          Subjective:  HPI:  Barry Potter is a 35 y.o. male who has Diabetic retinopathy ([place]); Refugee health examination; Insulin pump in place; Atopic dermatitis; Perennial allergic rhinitis with seasonal variation; Vitamin D deficiency; Type 1 diabetes mellitus without complications ([place]); GAD (generalized anxiety disorder); Gastroesophageal reflux disease; and Chronic frontal sinusitis on their problem list..   He  has a past medical history of Diabetes mellitus without complication ([place]).Marland Kitchen   He presents with chief complaint of Sinus Problem (Sinus pressure x months. ) .   Sinus Pain: Patient complains of congestion, facial pain, headache described as dull, nasal congestion, and sinus pressure.  He denies fevers, cough, and chest pain or shortness of breath.  Onset of symptoms was 3 months ago, unchanged since that time.  Patient is diabetic.  Reports that his hemoglobin A1c was last 6.2.  Patient reports that he was seen at urgent care about 1 and half months ago and treated with Augmentin and prednisone.  Reports that he has had no  improvement with symptoms.  Reports that he is also been trying OTC Sudafed without improvement.   Patient ports that his anxiety is much better.  He is self discontinued the Zoloft.     06/04/2022   10:34 AM 02/02/2022    8:58 AM 12/01/2021    9:04 AM  GAD 7 : Generalized Anxiety Score  Nervous, Anxious, on Edge 0 2 3  Control/stop worrying _0 Worry too much - different things _1 Trouble relaxing 0 2 1  Restless 0 2 0  Easily annoyed or irritable _2 Afraid - awful might happen _3 Total GAD 7 Score _4 Anxiety Difficulty Somewhat difficult Very difficult      Past Surgical History:  Procedure Laterality Date   TONSILLECTOMY  2001    Outpatient Medications Prior to Visit  Medication Sig Dispense Refill   famotidine (PEPCID) 20 MG tablet Take 1 tablet (20 mg total) by mouth daily as needed for heartburn or indigestion. 30 tablet 11   Glucagon, rDNA, (GLUCAGON EMERGENCY) 1 MG KIT See admin instructions.     glucose blood (CONTOUR NEXT TEST) test strip USE TO TEST BLOOD SUGAR LEVELS FIVE TIMES DAILY AS DIRECTED 150 each 11   insulin aspart (NOVOLOG) 100 UNIT/ML injection ADMINISTER 60 UNITS VIA INSULIN PUMP DAILY 90 mL 1   Insulin Human (INSULIN PUMP) SOLN Inject into the skin.     Multiple Vitamin (MULTIVITAMIN) tablet Take 1 tablet by mouth  daily.     clobetasol ointment (TEMOVATE) 0.05 % clobetasol 0.05 % topical ointment  APPLY A SMALL AMOUNT TO AREA ON CHIN TWICE DAILY FOR 4 WEEKS (Patient not taking: Reported on 06/04/2022)     fluticasone (FLONASE) 50 MCG/ACT nasal spray Place 2 sprays into both nostrils daily. (Patient not taking: Reported on 10/04/2017) 16 g 6   sertraline (ZOLOFT) 25 MG tablet Take 0.5 tablets (12.5 mg total) by mouth daily. (Patient not taking: Reported on 06/04/2022) 7 tablet 0   VITAMIN D, ERGOCALCIFEROL, PO Take 2 tablets by mouth daily. (Patient not taking: Reported on 02/02/2022)     No facility-administered medications prior to  visit.    Family History  Problem Relation Age of Onset   Diabetes Sister     Social History   Socioeconomic History   Marital status: Married    Spouse name: Not on file   Number of children: Not on file   Years of education: Not on file   Highest education level: Not on file  Occupational History   Occupation: Dealer  Tobacco Use   Smoking status: Never   Smokeless tobacco: Never  Vaping Use   Vaping Use: Never used  Substance and Sexual Activity   Alcohol use: No   Drug use: No   Sexual activity: Yes  Other Topics Concern   Not on file  Social History Narrative   Arrived in [place]:  Date: early April 2015   Home country: [place] leaving: fled [place] as family worked for [place] companyLanguage:  Therapist, art  Education:  8th grade; worked in receptionPreventative Care History:  Vaccinations: unknown   Social Determinants of Radio broadcast assistant Strain: Not on Art therapist Insecurity: Not on file  Transportation Needs: Not on file  Physical Activity: Not on file  Stress: Not on file  Social Connections: Not on file  Intimate Partner Violence: Not on file                                                                                                 Objective:  Physical Exam: BP 110/70 (BP Location: Left Arm, Patient Position: Sitting, Cuff Size: Large)   Pulse 79   Temp 97.8 F (36.6 C) (Temporal)   Wt 153 lb 6.4 oz (69.6 kg)   SpO2 98%   BMI 24.03 kg/m    General: No acute distress. Awake and conversant.  Eyes: Normal conjunctiva, anicteric. Round symmetric pupils.  ENT: Hearing grossly intact. No nasal discharge.  No polyps, no facial tenderness, bilateral otic canals and tympanic membranes normal, no oropharyngeal lesions Neck: Neck is supple. No masses or thyromegaly.   Respiratory: Respirations are non-labored. No auditory wheezing. Clear to auscultation bilaterally Skin: Warm. No rashes or ulcers.  Psych: Alert and oriented.  Cooperative, Appropriate mood and affect, Normal judgment.  CV: No cyanosis or JVD, RRR, MRG MSK: Normal ambulation. No clubbing  Neuro: Sensation and CN II-XII grossly normal.        Alesia Banda, MD, MS

## 2022-06-04 NOTE — Assessment & Plan Note (Signed)
Chronic frontal sinusitis, status post Augmentin and prednisone and OTC medications Insetting of diabetes, concern for possible mucormycosis, although patient very well-appearing today cannot rule out other causes of chronic sinusitis including allergies, polyps, septal defects etc. Will get CT sinus and refer to ENT ED and return precautions discussed

## 2022-06-04 NOTE — Assessment & Plan Note (Signed)
Stable, self discontinued medication

## 2022-06-08 ENCOUNTER — Telehealth: Payer: Self-pay

## 2022-06-08 DIAGNOSIS — E109 Type 1 diabetes mellitus without complications: Secondary | ICD-10-CM

## 2022-06-08 DIAGNOSIS — J321 Chronic frontal sinusitis: Secondary | ICD-10-CM

## 2022-06-08 NOTE — Addendum Note (Signed)
Addended by: Fanny Bien B on: 06/08/2022 10:52 AM   Modules accepted: Orders

## 2022-06-08 NOTE — Telephone Encounter (Signed)
Per Roanna Raider, can his CT order be changed to [facility]. His insurace wouldnt cover [employer]

## 2022-06-08 NOTE — Telephone Encounter (Signed)
Thanks

## 2022-06-16 NOTE — Telephone Encounter (Signed)
wife called just prior to visit to say pt could not come in today, 1st no show/same day cancel, fee waived, pt was seen 11/16

## 2022-07-09 ENCOUNTER — Ambulatory Visit
Admission: RE | Admit: 2022-07-09 | Discharge: 2022-07-09 | Disposition: A | Discharge status: Home / Self Care | Payer: Commercial Managed Care - HMO | Source: Ambulatory Visit | Attending: Family Medicine | Admitting: Family Medicine

## 2022-07-09 DIAGNOSIS — J321 Chronic frontal sinusitis: Secondary | ICD-10-CM

## 2022-07-09 DIAGNOSIS — E109 Type 1 diabetes mellitus without complications: Secondary | ICD-10-CM

## 2022-07-14 ENCOUNTER — Other Ambulatory Visit: Payer: Self-pay | Admitting: Nurse Practitioner

## 2022-07-14 MED ORDER — AMOXICILLIN-POT CLAVULANATE 875-125 MG PO TABS: 1.0000 | ORAL_TABLET | Freq: Two times a day (BID) | ORAL | 0 refills | Status: DC

## 2022-07-16 ENCOUNTER — Telehealth: Payer: Self-pay | Admitting: Family Medicine

## 2022-07-16 NOTE — Telephone Encounter (Signed)
Caller Name: Dickie La Call back phone #: [phone]  Reason for Call: Please call pt back, would like to discuss recommended ENT referral before proceeding

## 2022-07-16 NOTE — Telephone Encounter (Signed)
Tried calling patient's spouse, but no voicemail option available.  Reached out to patient and he stated he wasn't sure what questions she had. He tried calling her and there was no answer as well. He stated that she would call back later.

## 2022-07-27 ENCOUNTER — Telehealth: Payer: Self-pay | Admitting: Family Medicine

## 2022-07-27 DIAGNOSIS — J302 Other seasonal allergic rhinitis: Secondary | ICD-10-CM

## 2022-07-27 DIAGNOSIS — J321 Chronic frontal sinusitis: Secondary | ICD-10-CM

## 2022-07-27 NOTE — Telephone Encounter (Signed)
Caller Name: Erasmo Score Call back phone #: [phone]  Reason for Call: Pt was given 10 day supply for sinus infection. Still no improvement, would like referral to ENT. Please call wife

## 2022-07-28 NOTE — Telephone Encounter (Signed)
Left patient a detailed voice message advising sophia that referral has been placed.

## 2022-08-10 DIAGNOSIS — Z2882 Immunization not carried out because of caregiver refusal: Secondary | ICD-10-CM | POA: Diagnosis not present

## 2022-08-18 ENCOUNTER — Telehealth: Payer: Self-pay | Admitting: Family Medicine

## 2022-08-18 DIAGNOSIS — J324 Chronic pansinusitis: Secondary | ICD-10-CM

## 2022-08-18 NOTE — Telephone Encounter (Signed)
Please advise, see below.   

## 2022-08-18 NOTE — Telephone Encounter (Signed)
Pt is still struggling with a sinus infection. He feels that Augmentin was not strong enough.  He would like to have prednisone and zpak called in . The referral to ENT couldn't see him until March and he is too uncomfortable.  [phone]

## 2022-08-18 NOTE — Addendum Note (Signed)
Addended by: Bonnita Hollow on: 08/18/2022 05:58 PM   Modules accepted: Orders

## 2022-08-19 NOTE — Telephone Encounter (Signed)
Spoke with patient and advised him of annotation below. Offered him an appointment with Dr. Grandville Silos on 2/5 and he declined and wanted to be seen sooner. Offered an appointment with Dr. Gena Fray on 08/20/2021 and he confirmed 9 am.

## 2022-08-20 ENCOUNTER — Encounter: Payer: Self-pay | Admitting: Family Medicine

## 2022-08-20 ENCOUNTER — Ambulatory Visit: Payer: 59 | Admitting: Family Medicine

## 2022-08-20 VITALS — BP 118/70 | HR 66 | Temp 97.7°F | Ht 67.0 in | Wt 156.4 lb

## 2022-08-20 DIAGNOSIS — J0141 Acute recurrent pansinusitis: Secondary | ICD-10-CM | POA: Insufficient documentation

## 2022-08-20 MED ORDER — LEVOFLOXACIN 500 MG PO TABS: 500.0000 mg | ORAL_TABLET | Freq: Every day | ORAL | 0 refills | Status: AC

## 2022-08-20 MED ORDER — METHYLPREDNISOLONE 4 MG PO TBPK: ORAL_TABLET | ORAL | 0 refills | Status: AC

## 2022-08-20 NOTE — Assessment & Plan Note (Signed)
Barry Potter appears to have a recurrent sinus infection. I will place him on a course of levofloxacin this time. I will also prescribe a Medrol dosepak, as there may be a chronic allergic component. He will follow-up with ENT when he can get in for this.

## 2022-08-20 NOTE — Progress Notes (Signed)
[facility] [address] [place] [address] Dept: [phone] Dept Fax: [phone]  Office Visit  Subjective:    Patient ID: Barry Potter, male    DOB: 11/16/1985, 37 y.o..   MRN: [id number]  Chief Complaint  Patient presents with   Acute Visit    C/o having sinus pressure/drainage, ear pain x 3 weeks. Has take Claritin, nasal sprays.      History of Present Illness:  Patient is in today for evaluation of ongoing sinus issues. Barry Potter was seen in Sept. in Urgent Care and felt to be having a sinusitis. He was treated with Augmentin and a course of prednisone. When following up with Dr. Grandville Silos in Nov., he had some persistent sinus symptoms. He was sent of a CT scan and referred to ENT. He was also prescribed an Atrovent nasal spray. Around Christmas, he was prescribed another course of Augmentin. He noted some mild improvement while on the medication. However, soon after completing this course, he had a return of nasal congestion, burning sensation in the nose, and pressure over the sinus with bending over. He notes at times his nose drips clear fluid while bending over.   Past Medical History: Patient Active Problem List   Diagnosis Date Noted   Acute recurrent pansinusitis 08/20/2022   Chronic frontal sinusitis 06/04/2022   Gastroesophageal reflux disease 02/02/2022   Type 1 diabetes mellitus without complications ([place]) 70/62/3762   GAD (generalized anxiety disorder) 12/01/2021   Atopic dermatitis 01/23/2021   Perennial allergic rhinitis with seasonal variation 01/23/2021   Vitamin D deficiency 01/23/2021   Insulin pump in place 10/04/2017   Diabetic retinopathy ([place]) 12/04/2013   Refugee health examination 12/04/2013   Past Surgical History:  Procedure Laterality Date   TONSILLECTOMY  2001   Family History  Problem Relation Age of Onset   Diabetes Sister    Outpatient Medications Prior to Visit  Medication  Sig Dispense Refill   famotidine (PEPCID) 20 MG tablet Take 1 tablet (20 mg total) by mouth daily as needed for heartburn or indigestion. 30 tablet 11   Glucagon, rDNA, (GLUCAGON EMERGENCY) 1 MG KIT See admin instructions.     glucose blood (CONTOUR NEXT TEST) test strip USE TO TEST BLOOD SUGAR LEVELS FIVE TIMES DAILY AS DIRECTED 150 each 11   insulin aspart (NOVOLOG) 100 UNIT/ML injection ADMINISTER 60 UNITS VIA INSULIN PUMP DAILY 90 mL 1   Insulin Human (INSULIN PUMP) SOLN Inject into the skin.     ipratropium (ATROVENT) 0.06 % nasal spray Place 2 sprays into both nostrils 4 (four) times daily. 15 mL 12   Multiple Vitamin (MULTIVITAMIN) tablet Take 1 tablet by mouth daily.     amoxicillin-clavulanate (AUGMENTIN) 875-125 MG tablet Take 1 tablet by mouth 2 (two) times daily. 20 tablet 0   No facility-administered medications prior to visit.   No Known Allergies    Objective:   Today's Vitals   08/20/22 0858  BP: 118/70  Pulse: 66  Temp: 97.7 F (36.5 C)  TempSrc: Temporal  SpO2: 98%  Weight: 156 lb 6.4 oz (70.9 kg)  Height: 5\' 7"  (1.702 m)   Body mass index is 24.5 kg/m.   General: Well developed, well nourished. No acute distress. HEENT: Normocephalic, non-traumatic. Conjunctiva clear. External ears normal. EAC and TMs normal bilaterally.   Nasal passages are narrowed with swellign of the turbinates. Tehre is purulent drainage noted. NO pian on   percussion over sinuses. Mucous membranes moist. Oropharynx clear.  Good dentition. Neck: Supple. No lymphadenopathy. No thyromegaly. Psych: Alert and oriented. Normal mood and affect.  Health Maintenance Due  Topic Date Due   OPHTHALMOLOGY EXAM  Never done   HIV Screening  Never done   Hepatitis C Screening  Never done   Diabetic kidney evaluation - Urine ACR  09/23/2017   FOOT EXAM  10/05/2018   HEMOGLOBIN A1C  11/18/2021   Imaging: CT Maxillofacial wo contrast (07/09/2022) IMPRESSION: Pansinusitis as detailed above,  including a large fluid level in the right sphenoid sinus.    Assessment & Plan:   Problem List Items Addressed This Visit       Respiratory   Acute recurrent pansinusitis - Primary    Barry Potter appears to have a recurrent sinus infection. I will place him on a course of levofloxacin this time. I will also prescribe a Medrol dosepak, as there may be a chronic allergic component. He will follow-up with ENT when he can get in for this.      Relevant Medications   levofloxacin (LEVAQUIN) 500 MG tablet   methylPREDNISolone (MEDROL DOSEPAK) 4 MG TBPK tablet    Return if symptoms worsen or fail to improve.   Haydee Salter, MD

## 2022-09-25 DIAGNOSIS — J209 Acute bronchitis, unspecified: Secondary | ICD-10-CM | POA: Diagnosis not present

## 2022-10-27 DIAGNOSIS — J324 Chronic pansinusitis: Secondary | ICD-10-CM | POA: Diagnosis not present

## 2022-11-18 DIAGNOSIS — Z8249 Family history of ischemic heart disease and other diseases of the circulatory system: Secondary | ICD-10-CM | POA: Diagnosis not present

## 2022-11-18 DIAGNOSIS — E109 Type 1 diabetes mellitus without complications: Secondary | ICD-10-CM | POA: Diagnosis not present

## 2022-11-18 DIAGNOSIS — I10 Essential (primary) hypertension: Secondary | ICD-10-CM | POA: Diagnosis not present

## 2022-11-18 DIAGNOSIS — Z5986 Financial insecurity: Secondary | ICD-10-CM | POA: Diagnosis not present

## 2022-12-16 DIAGNOSIS — E1065 Type 1 diabetes mellitus with hyperglycemia: Secondary | ICD-10-CM | POA: Diagnosis not present

## 2022-12-16 DIAGNOSIS — E109 Type 1 diabetes mellitus without complications: Secondary | ICD-10-CM | POA: Diagnosis not present

## 2022-12-23 DIAGNOSIS — E1065 Type 1 diabetes mellitus with hyperglycemia: Secondary | ICD-10-CM | POA: Diagnosis not present

## 2022-12-23 DIAGNOSIS — E109 Type 1 diabetes mellitus without complications: Secondary | ICD-10-CM | POA: Diagnosis not present

## 2023-01-26 IMAGING — CR DG CHEST 2V
2 series · 2 of 2 positions shown · non-contrast
Comparison: 05/20/2015

CLINICAL DATA: Chest tightness.

EXAM:
CHEST - 2 VIEW

[w chest pa]
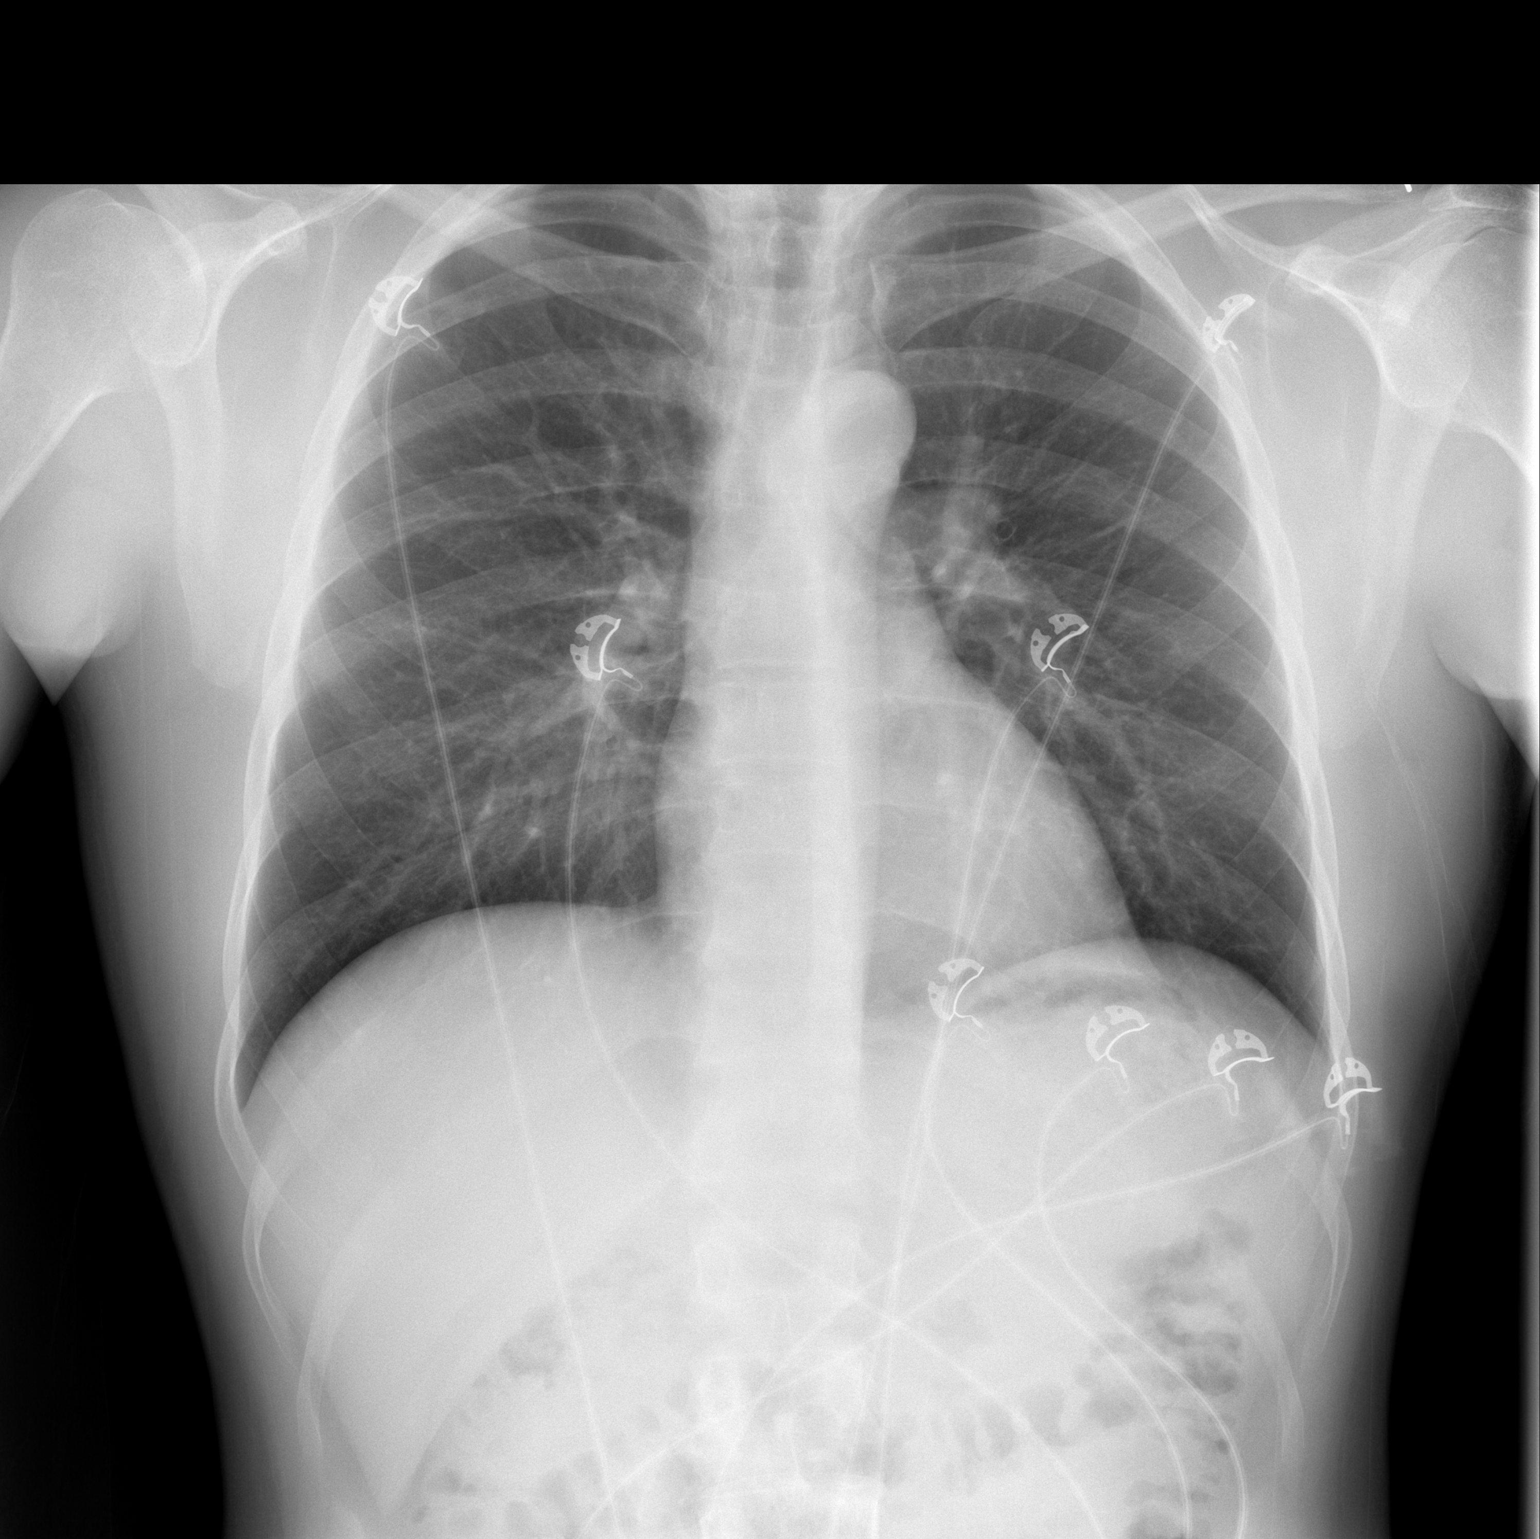

[w chest lat]
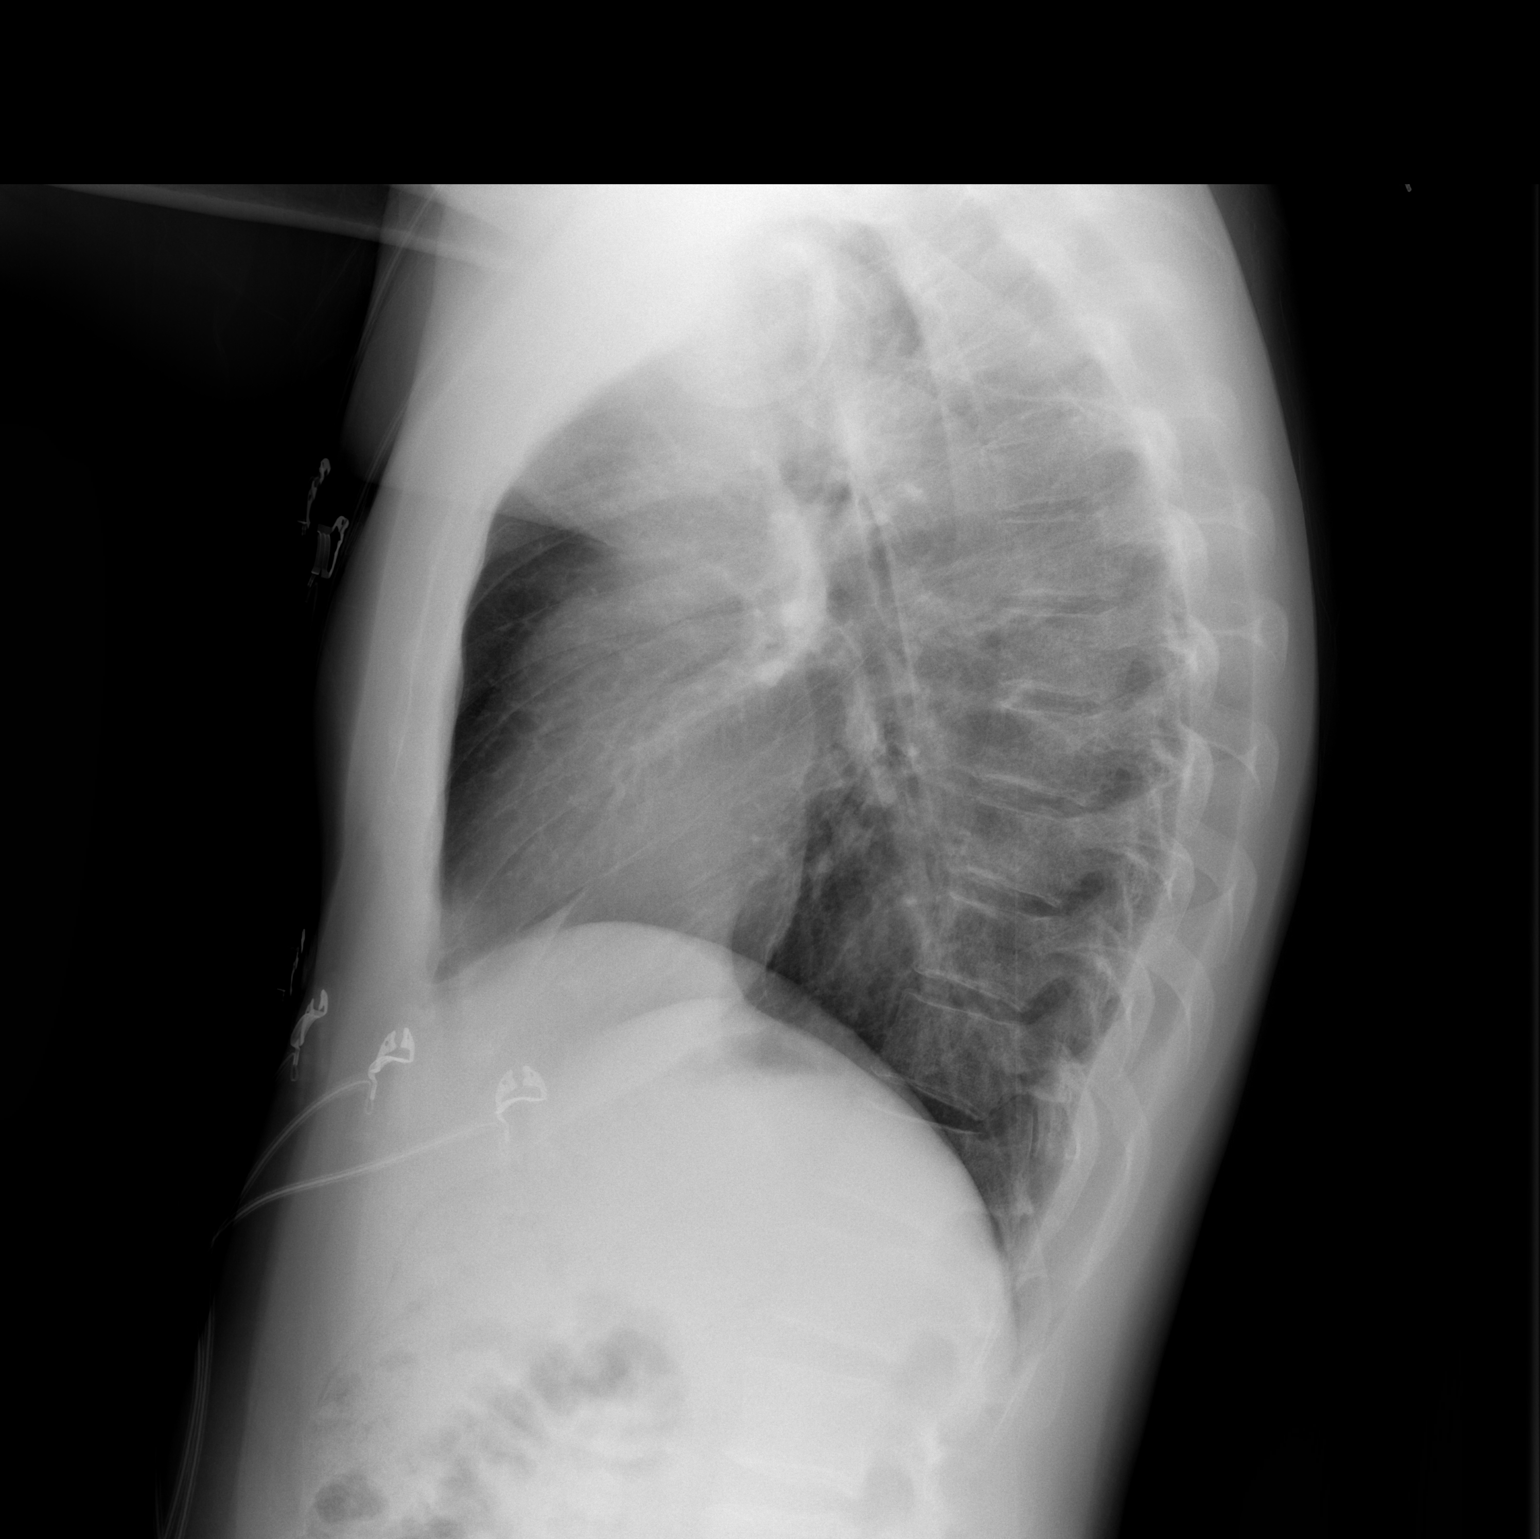

[2 of 2 positions shown; findings below may reference images not displayed]

FINDINGS: The cardiomediastinal contours are normal. The lungs are clear.
Pulmonary vasculature is normal. No consolidation, pleural effusion,
or pneumothorax. No acute osseous abnormalities are seen.
IMPRESSION: Negative radiographs of the chest.

## 2023-03-15 DIAGNOSIS — E1065 Type 1 diabetes mellitus with hyperglycemia: Secondary | ICD-10-CM | POA: Diagnosis not present

## 2023-03-15 DIAGNOSIS — E109 Type 1 diabetes mellitus without complications: Secondary | ICD-10-CM | POA: Diagnosis not present

## 2023-04-15 DIAGNOSIS — E1065 Type 1 diabetes mellitus with hyperglycemia: Secondary | ICD-10-CM | POA: Diagnosis not present

## 2023-04-20 DIAGNOSIS — Z9641 Presence of insulin pump (external) (internal): Secondary | ICD-10-CM | POA: Diagnosis not present

## 2023-04-20 DIAGNOSIS — E1065 Type 1 diabetes mellitus with hyperglycemia: Secondary | ICD-10-CM | POA: Diagnosis not present

## 2023-04-29 DIAGNOSIS — E1065 Type 1 diabetes mellitus with hyperglycemia: Secondary | ICD-10-CM | POA: Diagnosis not present

## 2023-04-29 DIAGNOSIS — E109 Type 1 diabetes mellitus without complications: Secondary | ICD-10-CM | POA: Diagnosis not present

## 2023-06-04 DIAGNOSIS — E1065 Type 1 diabetes mellitus with hyperglycemia: Secondary | ICD-10-CM | POA: Diagnosis not present

## 2023-06-04 DIAGNOSIS — E109 Type 1 diabetes mellitus without complications: Secondary | ICD-10-CM | POA: Diagnosis not present

## 2023-10-28 DIAGNOSIS — E1065 Type 1 diabetes mellitus with hyperglycemia: Secondary | ICD-10-CM | POA: Diagnosis not present

## 2023-11-02 DIAGNOSIS — E1065 Type 1 diabetes mellitus with hyperglycemia: Secondary | ICD-10-CM | POA: Diagnosis not present

## 2023-11-02 DIAGNOSIS — Z9641 Presence of insulin pump (external) (internal): Secondary | ICD-10-CM | POA: Diagnosis not present

## 2023-12-06 DIAGNOSIS — E103293 Type 1 diabetes mellitus with mild nonproliferative diabetic retinopathy without macular edema, bilateral: Secondary | ICD-10-CM | POA: Diagnosis not present

## 2023-12-06 DIAGNOSIS — H43393 Other vitreous opacities, bilateral: Secondary | ICD-10-CM | POA: Diagnosis not present

## 2023-12-06 DIAGNOSIS — H5700 Unspecified anomaly of pupillary function: Secondary | ICD-10-CM | POA: Diagnosis not present

## 2023-12-06 DIAGNOSIS — H25013 Cortical age-related cataract, bilateral: Secondary | ICD-10-CM | POA: Diagnosis not present

## 2023-12-21 DIAGNOSIS — E1065 Type 1 diabetes mellitus with hyperglycemia: Secondary | ICD-10-CM | POA: Diagnosis not present

## 2023-12-21 DIAGNOSIS — E109 Type 1 diabetes mellitus without complications: Secondary | ICD-10-CM | POA: Diagnosis not present

## 2024-02-01 DIAGNOSIS — E109 Type 1 diabetes mellitus without complications: Secondary | ICD-10-CM | POA: Diagnosis not present

## 2024-02-01 DIAGNOSIS — E1065 Type 1 diabetes mellitus with hyperglycemia: Secondary | ICD-10-CM | POA: Diagnosis not present

## 2024-02-08 DIAGNOSIS — Z9641 Presence of insulin pump (external) (internal): Secondary | ICD-10-CM | POA: Diagnosis not present

## 2024-02-08 DIAGNOSIS — E1065 Type 1 diabetes mellitus with hyperglycemia: Secondary | ICD-10-CM | POA: Diagnosis not present

## 2024-03-10 DIAGNOSIS — J019 Acute sinusitis, unspecified: Secondary | ICD-10-CM | POA: Diagnosis not present

## 2024-03-22 DIAGNOSIS — E109 Type 1 diabetes mellitus without complications: Secondary | ICD-10-CM | POA: Diagnosis not present

## 2024-03-22 DIAGNOSIS — E1065 Type 1 diabetes mellitus with hyperglycemia: Secondary | ICD-10-CM | POA: Diagnosis not present

## 2024-04-03 DIAGNOSIS — R5383 Other fatigue: Secondary | ICD-10-CM | POA: Diagnosis not present

## 2024-04-03 DIAGNOSIS — R0981 Nasal congestion: Secondary | ICD-10-CM | POA: Diagnosis not present

## 2024-04-03 DIAGNOSIS — R051 Acute cough: Secondary | ICD-10-CM | POA: Diagnosis not present
